# Patient Record
Sex: Female | Born: 1935 | Race: White | Hispanic: No | Smoking: Never smoker
Health system: Southern US, Community
[De-identification: ages and names within clinical notes are randomized; demographics above are authoritative.]

## PROBLEM LIST (undated history)

## (undated) DIAGNOSIS — I509 Heart failure, unspecified: Secondary | ICD-10-CM

## (undated) DIAGNOSIS — K219 Gastro-esophageal reflux disease without esophagitis: Secondary | ICD-10-CM

## (undated) DIAGNOSIS — R569 Unspecified convulsions: Secondary | ICD-10-CM

## (undated) DIAGNOSIS — E785 Hyperlipidemia, unspecified: Secondary | ICD-10-CM

## (undated) DIAGNOSIS — M1990 Unspecified osteoarthritis, unspecified site: Secondary | ICD-10-CM

## (undated) DIAGNOSIS — I10 Essential (primary) hypertension: Secondary | ICD-10-CM

## (undated) HISTORY — PX: BACK SURGERY: SHX140

## (undated) HISTORY — PX: FRACTURE SURGERY: SHX138

---

## 2004-01-28 ENCOUNTER — Ambulatory Visit: Payer: Self-pay

## 2004-11-24 ENCOUNTER — Emergency Department: Payer: Self-pay | Admitting: Emergency Medicine

## 2004-11-26 ENCOUNTER — Emergency Department: Payer: Self-pay | Admitting: Emergency Medicine

## 2004-12-02 ENCOUNTER — Other Ambulatory Visit: Payer: Self-pay

## 2004-12-02 ENCOUNTER — Ambulatory Visit: Payer: Self-pay | Admitting: Orthopedic Surgery

## 2004-12-03 ENCOUNTER — Ambulatory Visit: Payer: Self-pay | Admitting: Orthopedic Surgery

## 2005-02-27 ENCOUNTER — Ambulatory Visit: Payer: Self-pay

## 2006-05-20 ENCOUNTER — Ambulatory Visit: Payer: Self-pay | Admitting: Family Medicine

## 2007-01-13 ENCOUNTER — Ambulatory Visit: Payer: Self-pay | Admitting: Family Medicine

## 2007-02-01 ENCOUNTER — Ambulatory Visit: Payer: Self-pay | Admitting: Family Medicine

## 2007-06-11 ENCOUNTER — Emergency Department: Payer: Self-pay | Admitting: Emergency Medicine

## 2007-07-21 ENCOUNTER — Ambulatory Visit: Payer: Self-pay | Admitting: Family Medicine

## 2007-12-01 ENCOUNTER — Ambulatory Visit: Payer: Self-pay | Admitting: Family Medicine

## 2008-05-11 IMAGING — CR DG KNEE 1-2V*R*
1 series · 3 of 3 positions shown · non-contrast
Comparison: none

REASON FOR EXAM: pain
COMMENTS:

[Series 1: view not recorded · 0.17mm/px · 3 of 3 slices shown]
[im 1/3]
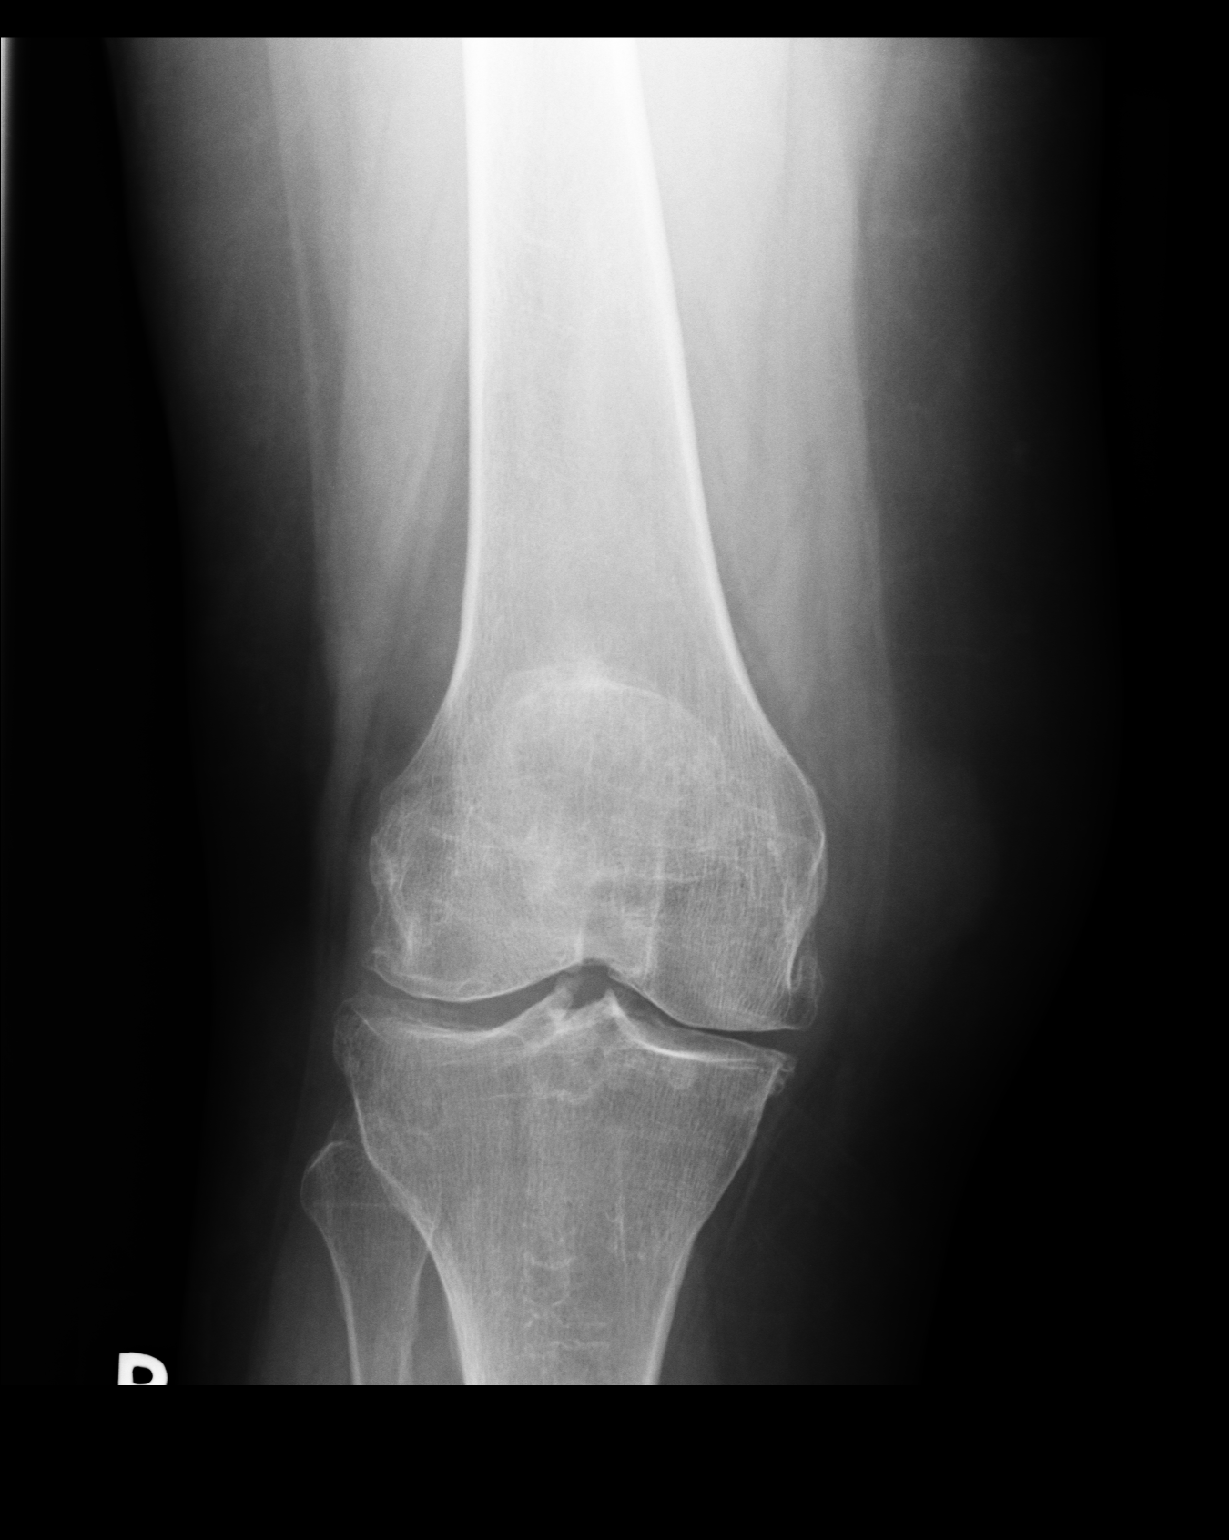
[im 2/3]
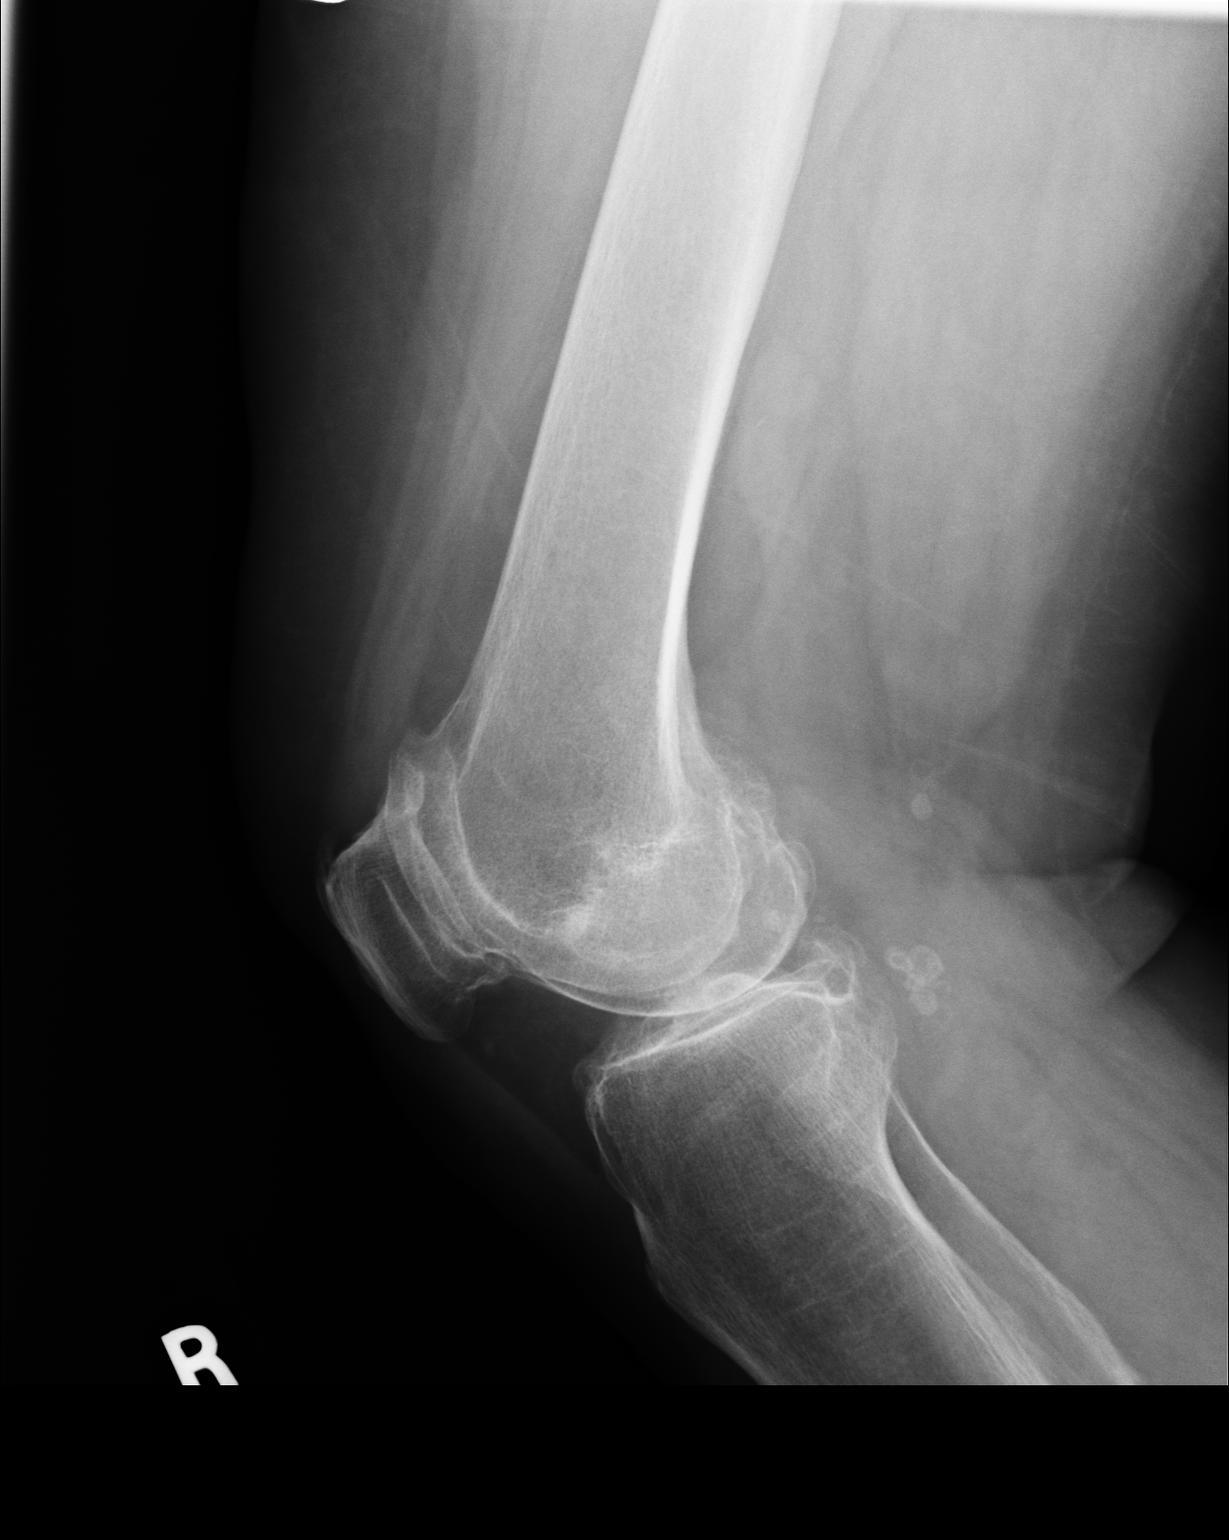
[im 3/3]
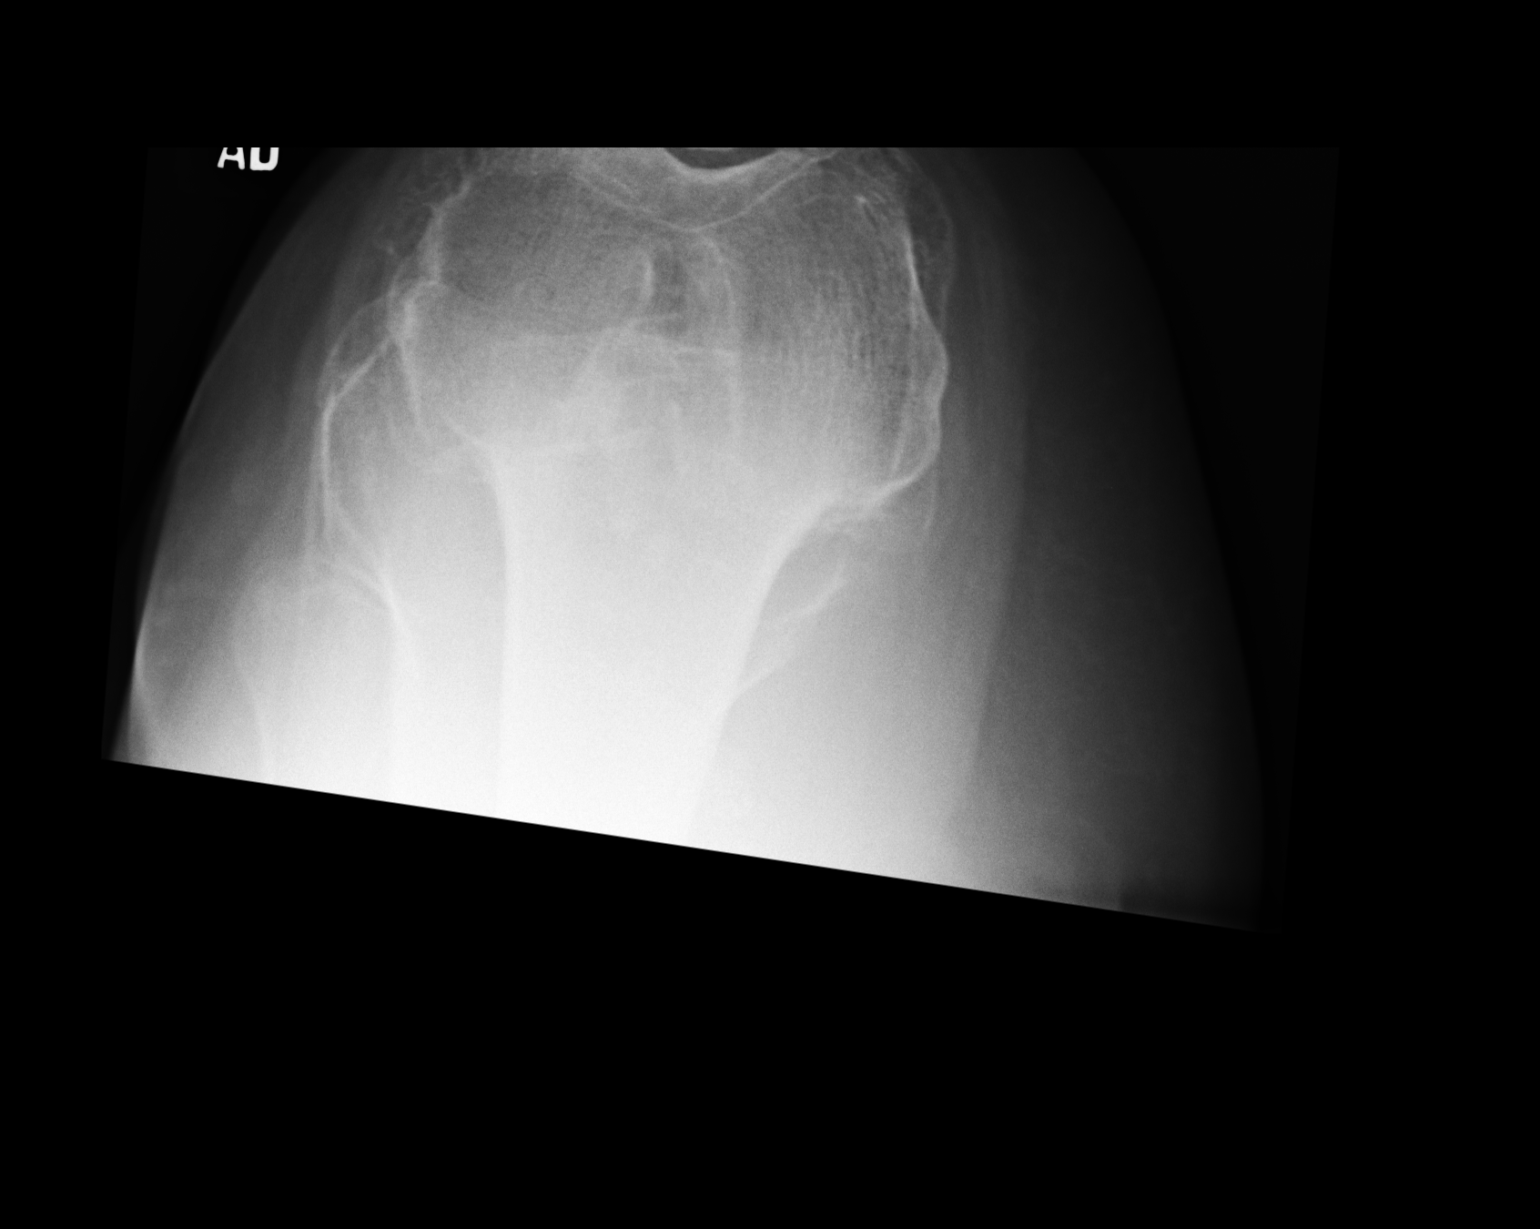

[3 of 3 positions shown; findings below may reference images not displayed]

PROCEDURE:     DXR - DXR KNEE RIGHT AP AND LATERAL  - February 01, 2007 [DATE]

RESULT:     AP and lateral views of the RIGHT knee show no fracture or
dislocation. Spur formation is noted medially. The findings are similar to
those noted on the LEFT and are consistent with arthritic change. There is
prominent dorsal patella spurring. No lytic or blastic lesions are seen.
IMPRESSION: 1. Arthritic change is noted at the knee joint medially and at the
femoropatellar joint.
2. No fracture is seen.

## 2008-08-01 ENCOUNTER — Ambulatory Visit: Payer: Self-pay | Admitting: Family Medicine

## 2008-09-26 ENCOUNTER — Ambulatory Visit: Payer: Self-pay | Admitting: Family Medicine

## 2008-10-18 ENCOUNTER — Emergency Department: Payer: Self-pay | Admitting: Emergency Medicine

## 2008-11-30 ENCOUNTER — Ambulatory Visit: Payer: Self-pay | Admitting: Gastroenterology

## 2008-12-19 ENCOUNTER — Emergency Department: Payer: Self-pay | Admitting: Emergency Medicine

## 2009-08-06 ENCOUNTER — Ambulatory Visit: Payer: Self-pay | Admitting: Family Medicine

## 2010-04-23 ENCOUNTER — Ambulatory Visit: Payer: Self-pay | Admitting: Ophthalmology

## 2010-05-05 ENCOUNTER — Ambulatory Visit: Payer: Self-pay | Admitting: Ophthalmology

## 2010-06-13 ENCOUNTER — Ambulatory Visit: Payer: Self-pay | Admitting: Ophthalmology

## 2010-06-16 ENCOUNTER — Ambulatory Visit: Payer: Self-pay | Admitting: Ophthalmology

## 2010-10-28 ENCOUNTER — Ambulatory Visit: Payer: Self-pay | Admitting: Family Medicine

## 2010-12-28 ENCOUNTER — Emergency Department: Payer: Self-pay | Admitting: Internal Medicine

## 2011-06-23 ENCOUNTER — Emergency Department: Payer: Self-pay | Admitting: Emergency Medicine

## 2011-06-25 ENCOUNTER — Ambulatory Visit: Payer: Self-pay | Admitting: Unknown Physician Specialty

## 2011-06-25 LAB — URINALYSIS, COMPLETE
Bilirubin,UR: NEGATIVE
Ketone: NEGATIVE
Leukocyte Esterase: NEGATIVE
Nitrite: NEGATIVE
Protein: NEGATIVE
Specific Gravity: 1.005 (ref 1.003–1.030)
Squamous Epithelial: 1
WBC UR: 1 /HPF (ref 0–5)

## 2011-07-02 ENCOUNTER — Ambulatory Visit: Payer: Self-pay | Admitting: Unknown Physician Specialty

## 2011-10-31 ENCOUNTER — Emergency Department: Payer: Self-pay | Admitting: Emergency Medicine

## 2011-12-01 ENCOUNTER — Ambulatory Visit: Payer: Self-pay | Admitting: Family Medicine

## 2012-09-02 ENCOUNTER — Other Ambulatory Visit: Payer: Self-pay | Admitting: Family Medicine

## 2012-09-02 LAB — PHENYTOIN LEVEL, TOTAL: Dilantin: 13.8 ug/mL (ref 10.0–20.0)

## 2012-12-09 ENCOUNTER — Ambulatory Visit: Payer: Self-pay | Admitting: Family Medicine

## 2013-11-11 ENCOUNTER — Emergency Department: Payer: Self-pay | Admitting: Emergency Medicine

## 2013-11-12 LAB — COMPREHENSIVE METABOLIC PANEL
ALT: 34 U/L
AST: 25 U/L (ref 15–37)
Albumin: 3.6 g/dL (ref 3.4–5.0)
Alkaline Phosphatase: 69 U/L
Anion Gap: 11 (ref 7–16)
BUN: 16 mg/dL (ref 7–18)
Bilirubin,Total: 0.3 mg/dL (ref 0.2–1.0)
CHLORIDE: 103 mmol/L (ref 98–107)
CREATININE: 0.76 mg/dL (ref 0.60–1.30)
Calcium, Total: 8.7 mg/dL (ref 8.5–10.1)
Co2: 24 mmol/L (ref 21–32)
EGFR (African American): >60
GLUCOSE: 104 mg/dL — AB (ref 65–99)
Osmolality: 277 (ref 275–301)
Potassium: 3.3 mmol/L — ABNORMAL LOW (ref 3.5–5.1)
SODIUM: 138 mmol/L (ref 136–145)
Total Protein: 7.3 g/dL (ref 6.4–8.2)

## 2013-11-12 LAB — CBC
HCT: 39.1 % (ref 35.0–47.0)
HGB: 12.9 g/dL (ref 12.0–16.0)
MCH: 32.4 pg (ref 26.0–34.0)
MCHC: 33.1 g/dL (ref 32.0–36.0)
MCV: 98 fL (ref 80–100)
Platelet: 213 10*3/uL (ref 150–440)
RBC: 3.99 10*6/uL (ref 3.80–5.20)
RDW: 12.7 % (ref 11.5–14.5)
WBC: 5.6 10*3/uL (ref 3.6–11.0)

## 2013-11-12 LAB — TROPONIN I: Troponin-I: <0.02 ng/mL

## 2013-11-12 LAB — PHENYTOIN LEVEL, TOTAL: Dilantin: 24.6 ug/mL — ABNORMAL HIGH (ref 10.0–20.0)

## 2013-12-11 ENCOUNTER — Inpatient Hospital Stay: Payer: Self-pay | Admitting: Internal Medicine

## 2013-12-11 LAB — CBC
HCT: 38.4 % (ref 35.0–47.0)
HGB: 12.6 g/dL (ref 12.0–16.0)
MCH: 32.1 pg (ref 26.0–34.0)
MCHC: 33 g/dL (ref 32.0–36.0)
MCV: 98 fL (ref 80–100)
Platelet: 211 10*3/uL (ref 150–440)
RBC: 3.93 10*6/uL (ref 3.80–5.20)
RDW: 13.1 % (ref 11.5–14.5)
WBC: 5.1 10*3/uL (ref 3.6–11.0)

## 2013-12-11 LAB — TROPONIN I: Troponin-I: <0.02 ng/mL

## 2013-12-11 LAB — URINALYSIS, COMPLETE
Bacteria: NONE SEEN
Bilirubin,UR: NEGATIVE
Glucose,UR: NEGATIVE mg/dL (ref 0–75)
Ketone: NEGATIVE
Nitrite: NEGATIVE
PH: 8 (ref 4.5–8.0)
Protein: NEGATIVE
RBC,UR: <1 /HPF (ref 0–5)
SPECIFIC GRAVITY: 1.005 (ref 1.003–1.030)
Squamous Epithelial: 3

## 2013-12-11 LAB — COMPREHENSIVE METABOLIC PANEL
ALBUMIN: 3.5 g/dL (ref 3.4–5.0)
ANION GAP: 7 (ref 7–16)
Alkaline Phosphatase: 66 U/L
BUN: 13 mg/dL (ref 7–18)
Bilirubin,Total: 0.3 mg/dL (ref 0.2–1.0)
CALCIUM: 8.8 mg/dL (ref 8.5–10.1)
CO2: 29 mmol/L (ref 21–32)
Chloride: 104 mmol/L (ref 98–107)
Creatinine: 0.77 mg/dL (ref 0.60–1.30)
EGFR (Non-African Amer.): >60
Glucose: 105 mg/dL — ABNORMAL HIGH (ref 65–99)
OSMOLALITY: 280 (ref 275–301)
Potassium: 3.7 mmol/L (ref 3.5–5.1)
SGOT(AST): 21 U/L (ref 15–37)
SGPT (ALT): 23 U/L
Sodium: 140 mmol/L (ref 136–145)
Total Protein: 7.1 g/dL (ref 6.4–8.2)

## 2013-12-11 LAB — PHENYTOIN LEVEL, TOTAL: Dilantin: 42.6 ug/mL (ref 10.0–20.0)

## 2013-12-12 LAB — CBC WITH DIFFERENTIAL/PLATELET
BASOS PCT: 0.5 %
Basophil #: 0 10*3/uL (ref 0.0–0.1)
EOS ABS: 0.2 10*3/uL (ref 0.0–0.7)
Eosinophil %: 4.1 %
HCT: 36.6 % (ref 35.0–47.0)
HGB: 12.2 g/dL (ref 12.0–16.0)
LYMPHS PCT: 19.8 %
Lymphocyte #: 1.1 10*3/uL (ref 1.0–3.6)
MCH: 32.2 pg (ref 26.0–34.0)
MCHC: 33.3 g/dL (ref 32.0–36.0)
MCV: 97 fL (ref 80–100)
MONOS PCT: 10.7 %
Monocyte #: 0.6 x10 3/mm (ref 0.2–0.9)
Neutrophil #: 3.5 10*3/uL (ref 1.4–6.5)
Neutrophil %: 64.9 %
Platelet: 190 10*3/uL (ref 150–440)
RBC: 3.8 10*6/uL (ref 3.80–5.20)
RDW: 13 % (ref 11.5–14.5)
WBC: 5.4 10*3/uL (ref 3.6–11.0)

## 2013-12-12 LAB — COMPREHENSIVE METABOLIC PANEL
ALBUMIN: 3.2 g/dL — AB (ref 3.4–5.0)
ANION GAP: 5 — AB (ref 7–16)
Alkaline Phosphatase: 63 U/L
BILIRUBIN TOTAL: 0.4 mg/dL (ref 0.2–1.0)
BUN: 8 mg/dL (ref 7–18)
CALCIUM: 8.5 mg/dL (ref 8.5–10.1)
Chloride: 107 mmol/L (ref 98–107)
Co2: 29 mmol/L (ref 21–32)
Creatinine: 0.7 mg/dL (ref 0.60–1.30)
EGFR (African American): >60
EGFR (Non-African Amer.): >60
Glucose: 124 mg/dL — ABNORMAL HIGH (ref 65–99)
OSMOLALITY: 281 (ref 275–301)
POTASSIUM: 3.4 mmol/L — AB (ref 3.5–5.1)
SGOT(AST): 17 U/L (ref 15–37)
SGPT (ALT): 21 U/L
Sodium: 141 mmol/L (ref 136–145)
Total Protein: 6.5 g/dL (ref 6.4–8.2)

## 2013-12-12 LAB — LIPID PANEL
Cholesterol: 134 mg/dL (ref 0–200)
HDL: 54 mg/dL (ref 40–60)
LDL CHOLESTEROL, CALC: 52 mg/dL (ref 0–100)
TRIGLYCERIDES: 141 mg/dL (ref 0–200)
VLDL CHOLESTEROL, CALC: 28 mg/dL (ref 5–40)

## 2013-12-12 LAB — PHENYTOIN LEVEL, TOTAL: Dilantin: 39.3 ug/mL (ref 10.0–20.0)

## 2013-12-13 LAB — PHENYTOIN LEVEL, TOTAL: Dilantin: 34.7 ug/mL — ABNORMAL HIGH (ref 10.0–20.0)

## 2014-07-07 NOTE — Discharge Summary (Signed)
PATIENT NAME:  Hannah DallasAYLOR, Ahlana B MR#:  [id number] DATE OF BIRTH:  1934/09/23  DATE OF ADMISSION:  12/11/2013 DATE OF DISCHARGE:  12/14/2013  ADMITTING DIAGNOSIS: Confusion, weakness, ataxia.   DISCHARGE DIAGNOSES: 1.  Weakness and ataxia, likely related to Dilantin toxicity. Her gait is much improved.  2.  Encephalopathy, possibly related to Dilantin toxicity. There was no evidence of acute cerebrovascular accident noted on MRI.  3.  Previous history of cerebrovascular accident, based on MRI.  4.  Gait instability and deconditioning. The patient will have physical therapy at home.  5.  Hypertension.  6.  Hyperlipidemia.  7.  History of seizure.  8.  History of inflammatory polyarthritis.  9.  History of hyperlipidemia.  10.  History of asthma.  11.  Gastroesophageal reflux disease. 12.  Status post hysterectomy.  13.  Status post right wrist surgery.  CONSULTANTS: Physical therapy.   DIAGNOSTIC DATA: Labs: WBC count on admission 5, hemoglobin 12.5, platelet count 211,000. Sodium 140, potassium 3.7, chloride 104, bicarb 29, BUN 13, creatinine 0.77, glucose 105, calcium 8.8, AST 23, ALT 21.  CT of the head showed age indeterminate left PCA territory infarct.   EKG: Normal sinus rhythm.  Dilantin level was 34.7 on September 30th.   HOSPITAL COURSE: Please refer to H and P done by the admitting physician. The patient is a 80 year old white female who was brought by EMS after the patient had confusion and weakness and trouble with ambulation. Apparently the patient was not taking her Dilantin correctly and was noted to have high Dilantin level on admission. She was thought to have Dilantin toxicity. She was also confused on presentation and was admitted to the hospital, given IV fluids. Her Dilantin was held. The patient also underwent a MRI for acute encephalopathy. There was old stroke, but nothing new. She was monitored in the hospital with improvement in her mental status and her gait. She  was seen by physical therapy who recommended home with PT. At this time, arrangements for home with physical therapy have been made. She is doing much better and is stable for discharge.   DISCHARGE MEDICATIONS: Omeprazole 20 one tab p.o. daily, hydrochlorothiazide/losartan 25/100 daily, meloxicam 7.5 one tab p.o. b.i.d., pravastatin 20 at bedtime, Dilantin 300 at bedtime, acetaminophen 650 q. 4 p.r.n., aspirin 81 one tab p.o. daily.   DISCHARGE DIET: Low-sodium, low-fat, low-cholesterol.   DISCHARGE ACTIVITY: As tolerated.   DISCHARGE FOLLOWUP AND INSTRUCTIONS: Follow up with primary MD in 1 to 2 weeks. Resume Dilantin on October 2nd at 300 mg at bedtime only. The patient to have physical therapy and nurse to visit her.   TIME SPENT: 35 minutes.  ____________________________ Lacie ScottsShreyang H. Allena KatzPatel, MD shp:sb D: 12/15/2013 08:36:14 ET T: 12/15/2013 11:31:29 ET JOB#: [id number]  cc: Kolston Lacount H. Allena KatzPatel, MD, <Dictator> Charise CarwinSHREYANG H Harlow Basley MD ELECTRONICALLY SIGNED 12/24/2013 8:54

## 2014-07-07 NOTE — H&P (Signed)
PATIENT NAME:  Hannah Gordon, OHMER MR#:  [id number] DATE OF BIRTH:  20-Aug-1934  DATE OF ADMISSION:  12/11/2013  PRIMARY CARE PHYSICIAN: As per the medical chart is Jerl Mina, MD   CHIEF COMPLAINT: Confusion, weakness, ataxia.   HISTORY OF PRESENT ILLNESS:  A 80 year old female who was brought in via EMS after receiving a call for confusion, weakness, and trouble walking.  The patient is not a good historian due to her confusion.  She had a CT of the head performed here which showed age indeterminate left PCA territory infarct.  Her Dilantin level was 42.61 twice normal.  Apparently, per speaking with the ER physician, her Dilantin has been recently increased.   REVIEW OF SYSTEMS:  Unable to obtain as the patient is very confused.   PAST MEDICAL HISTORY: As per medical chart:  1.  Hypertension.  2.  Inflammatory polyarthritis. 3.  Seizures. 4.  Arthritis.  5.  Hyperlipidemia.  6.  Asthma.  7.  GERD.   PAST SURGICAL HISTORY: As per medical chart: 1.  Hysterectomy. 2.  Right wrist.    ALLERGIES: No known drug allergies.   MEDICATIONS:  1.  Pravastatin 20 mg at bedtime.    2.  Phenytoin 100 mg 2 tablets b.i.d.  3.  Omeprazole 20 mg daily.  4.  Meloxicam 7.5 daily.  5.  Hydrochlorothiazide losartan 1 tablet daily.   FAMILY HISTORY: Unable to obtain as the patient was confused.   SOCIAL HISTORY: As per medical chart, no tobacco use, unknown about alcohol or IV drug use.   PHYSICAL EXAMINATION:  VITAL SIGNS: Temperature 98.3, pulse 74, respirations 18, blood pressure 116/48 and 100% on room air.  GENERAL: The patient is alert not oriented to place or date or time.  HEENT: Head is atraumatic.  Pupils are round and reactive.  Sclerae are anicteric.  Mucous membranes are moist.  OROPHARYNX: Clear.  NECK: Supple. No JVD, carotid bruit, or enlarged thyroid.  CARDIOVASCULAR: Regular rate and rhythm. No murmurs, gallops, or rubs. PMI is not displaced. LUNGS:  Clear to auscultation  without crackles, rales, rhonchi or wheezing.  No respiratory distress.  Normal chest expansion.  ABDOMEN:  Bowel sounds are positive. Nontender, nondistended.  No hepatosplenomegaly. No rebound, guarding.  EXTREMITIES: No clubbing, cyanosis or edema.  SKIN: Without rash or lesions.  BACK: No CVA or vertebral tenderness.  NEUROLOGIC:  Cranial nerves II through XII are intact.  She does have some right-sided mild nystagmus. Babinski sign is downgoing. Cerebellar exam, including heel-to-toe, is negative.  We did not ambulate the patient as she is very weak.  MUSCULOSKELETAL: There is 5/5 strength in all extremities.   LABORATORY DATA: White blood cell 5, hemoglobin 12.6, hematocrit 39,  platelets 211, 000, sodium 140, potassium 3.7, chloride 104, bicarbonate 29,  BUN 13, creatinine 0.77, glucose 105, calcium 8.8, bilirubin 0.3, alkaline phosphatase 66, AST 23, ALT 21, total protein 7.1, albumin 3.5.  Troponin less than 0.02, Dilantin 42.6.  CT of the head shows an age indeterminate left PCA territory infarct.   EKG is normal sinus rhythm. No ST elevation or depression.   ASSESSMENT AND PLAN: A 80 year old female with seizure disorder, recently had her Dilantin was increased, who presents with increasing confusion, and ataxia with an elevated Dilantin level.  1.  Dilantin toxicity could be the etiology of her confusion and ataxia.  She had a CT of the head which shows an indeterminate left PCA territory stroke as well.  Obviously, holding her Dilantin level, the  Emergency Room physician felt that she needed an IVC as she was agitated prior to me seeing her which they are filling out her IVC Dilantin toxicity.  We will continue to monitor the patient's symptoms with neurologic checks every 4 hours.  Provide IV fluids.  Recheck a Dilantin level.  2.  Encephalopathy, acute, likely secondary to Dilantin toxicity.  We will rule out an acute stroke with an MRI.  I have started the patient on aspirin.  She will  continue her statin medication.  3.  History of hypertension, continue hydrochlorothiazide losartan.  4.  Hyperlipidemia. Continue pravastatin.  5.  History of seizures.  The patient's Dilantin level is supratherapeutic.  We will hold Dilantin. Monitor and the patient should be on some seizure precautions with neurologic checks.   CODE STATUS: The patient at this time appears to be a FULL CODE STATUS.  We will clarify once her mental status is back to baseline.   TIME SPENT:  Approximately 45 minutes.     ____________________________ Janyth ContesSital P. Juliene PinaMody, MD spm:DT D: 12/11/2013 13:48:53 ET T: 12/11/2013 14:19:55 ET JOB#: [id number]  cc: Raiven Belizaire P. Juliene PinaMody, MD, <Dictator> Janyth ContesSITAL P Quame Spratlin MD ELECTRONICALLY SIGNED 12/11/2013 15:19

## 2014-07-08 NOTE — Op Note (Signed)
PATIENT NAME:  Hannah Gordon, Hannah Gordon MR#:  [id number] DATE OF BIRTH:  [birth year]  DATE OF PROCEDURE:  07/02/2011  PREOPERATIVE DIAGNOSIS: Comminuted displaced left distal radius and ulnar fracture.   POSTOPERATIVE DIAGNOSIS: Comminuted displaced left distal radius and ulnar fracture.   PROCEDURE: Open reduction and internal fixation of distal radius.  SURGEON: Ruthann CancerJames Shamica Moree, MD  ASSISTANT: None.   ANESTHESIA: General.   ESTIMATED BLOOD LOSS:   COMPLICATIONS: None.   TOURNIQUET: Not inflated.   IMPLANTS USED: [employer] volar plate.   BRIEF CLINICAL NOTE AND PATHOLOGY: The patient suffered the above-mentioned fracture. Surgery was recommended, options, risks, and benefits were discussed. At time of the procedure, the fracture did reduce well and fixation was good.   DESCRIPTION OF PROCEDURE: Preop antibiotics, adequate general anesthesia, supine position, routine prepping and draping. Appropriate time out was called. The fracture was reduced with traction and manipulation and countertraction. Direct exposure was made using the usual volar plates retracting the flexor carpi radialis to the ulnar aspect, neurovascular structures to the radial aspect. Pronator was elevated. The fracture was directly visualized and further reduced with an osteotome. The plate was then applied in routine fashion and adjusted appropriately. The distal threaded pegs and regular pegs were applied. Fixation was excellent. The third cortical screw was placed. AP, lateral, and oblique views showed good positioning. The incision was thoroughly irrigated. The area had previously been injected with Marcaine. Hemostasis was good. Pronator was reapproximated. Subcutaneous was closed with 2-0 Vicryl. Skin was closed with Monocryl. A soft sterile dressing was applied. Sponge and needle counts were reported as correct prior to and after wound closure. The patient was awakened and taken to the postanesthesia care unit having  tolerated the procedure well, in a wrist splint.  ____________________________ Winn JockJames C. Gerrit Heckaliff, MD jcc:slb D: 07/02/2011 14:32:09 ET T: 07/02/2011 16:29:57 ET JOB#: [id number]  cc: Winn JockJames C. Gerrit Heckaliff, MD, <Dictator> Winn JockJAMES C Dominyk Law MD ELECTRONICALLY SIGNED 07/05/2011 18:46

## 2015-01-07 ENCOUNTER — Other Ambulatory Visit: Payer: Self-pay | Admitting: Family Medicine

## 2015-01-07 DIAGNOSIS — I10 Essential (primary) hypertension: Secondary | ICD-10-CM | POA: Diagnosis not present

## 2015-01-07 DIAGNOSIS — N390 Urinary tract infection, site not specified: Secondary | ICD-10-CM | POA: Diagnosis not present

## 2015-01-07 DIAGNOSIS — M1990 Unspecified osteoarthritis, unspecified site: Secondary | ICD-10-CM | POA: Diagnosis not present

## 2015-01-07 DIAGNOSIS — R569 Unspecified convulsions: Secondary | ICD-10-CM | POA: Diagnosis not present

## 2015-01-07 DIAGNOSIS — Z1239 Encounter for other screening for malignant neoplasm of breast: Secondary | ICD-10-CM | POA: Diagnosis not present

## 2015-01-07 DIAGNOSIS — Z1231 Encounter for screening mammogram for malignant neoplasm of breast: Secondary | ICD-10-CM

## 2015-01-07 DIAGNOSIS — K219 Gastro-esophageal reflux disease without esophagitis: Secondary | ICD-10-CM | POA: Diagnosis not present

## 2015-01-07 DIAGNOSIS — E785 Hyperlipidemia, unspecified: Secondary | ICD-10-CM | POA: Diagnosis not present

## 2015-01-08 DIAGNOSIS — N390 Urinary tract infection, site not specified: Secondary | ICD-10-CM | POA: Diagnosis not present

## 2015-01-16 ENCOUNTER — Ambulatory Visit: Payer: Self-pay | Attending: Family Medicine

## 2015-03-14 DIAGNOSIS — M25561 Pain in right knee: Secondary | ICD-10-CM | POA: Diagnosis not present

## 2015-03-14 DIAGNOSIS — G40909 Epilepsy, unspecified, not intractable, without status epilepticus: Secondary | ICD-10-CM | POA: Diagnosis not present

## 2015-03-14 DIAGNOSIS — M2550 Pain in unspecified joint: Secondary | ICD-10-CM | POA: Diagnosis not present

## 2015-03-14 DIAGNOSIS — M1990 Unspecified osteoarthritis, unspecified site: Secondary | ICD-10-CM | POA: Diagnosis not present

## 2015-03-14 DIAGNOSIS — R569 Unspecified convulsions: Secondary | ICD-10-CM | POA: Diagnosis not present

## 2015-03-14 DIAGNOSIS — I10 Essential (primary) hypertension: Secondary | ICD-10-CM | POA: Diagnosis not present

## 2015-03-14 DIAGNOSIS — G8929 Other chronic pain: Secondary | ICD-10-CM | POA: Diagnosis not present

## 2015-03-14 DIAGNOSIS — J0100 Acute maxillary sinusitis, unspecified: Secondary | ICD-10-CM | POA: Diagnosis not present

## 2015-03-21 DIAGNOSIS — R739 Hyperglycemia, unspecified: Secondary | ICD-10-CM | POA: Diagnosis not present

## 2015-03-21 IMAGING — CT CT HEAD WITHOUT CONTRAST
1 series · 16 of 30 positions shown, 20 images · non-contrast
Comparison: None.

CLINICAL DATA: Weakness and slurred speech.

EXAM:
CT HEAD WITHOUT CONTRAST
TECHNIQUE: Contiguous axial images were obtained from the base of the skull
through the vertex without intravenous contrast.

[Series 2: head wo · axial · 0.41mm/px · z∈[-630,-504]mm · 16 of 32 slices shown, 20 images]
[im 2/32  brain]
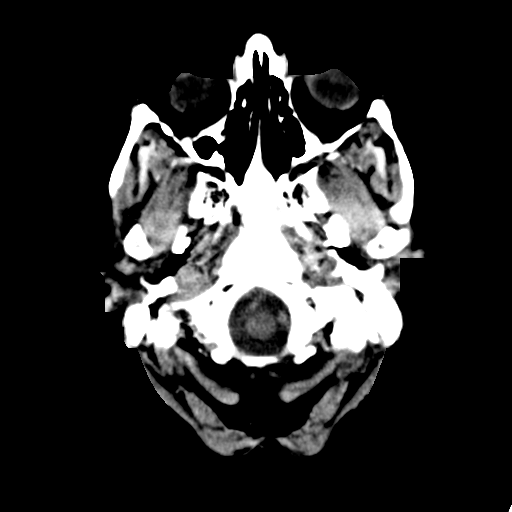
[im 2/32  bone]
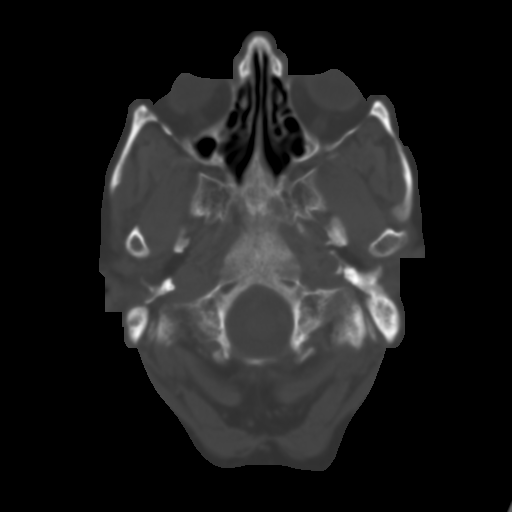
[im 4/32  brain]
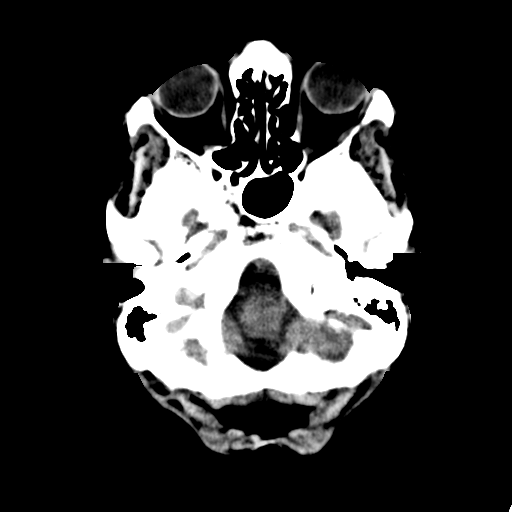
[im 6/32  brain]
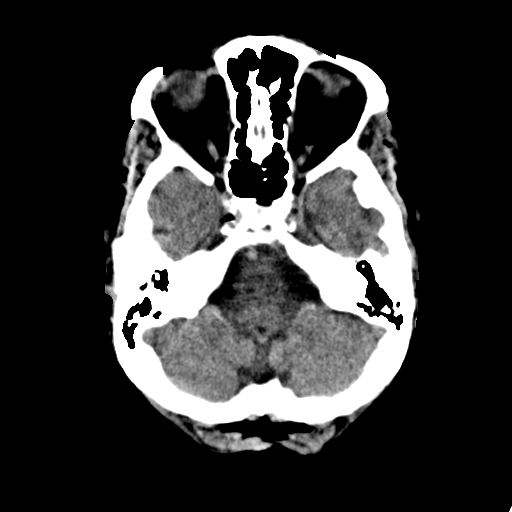
[im 8/32  brain]
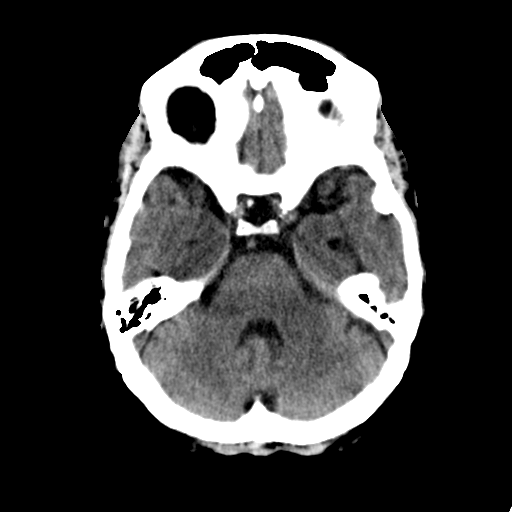
[im 9/32  brain]
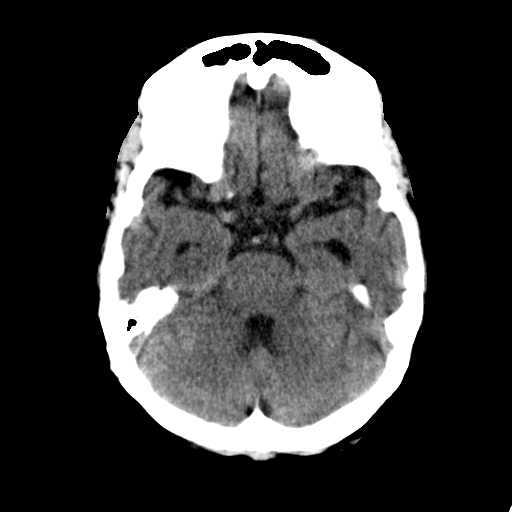
[im 9/32  bone]
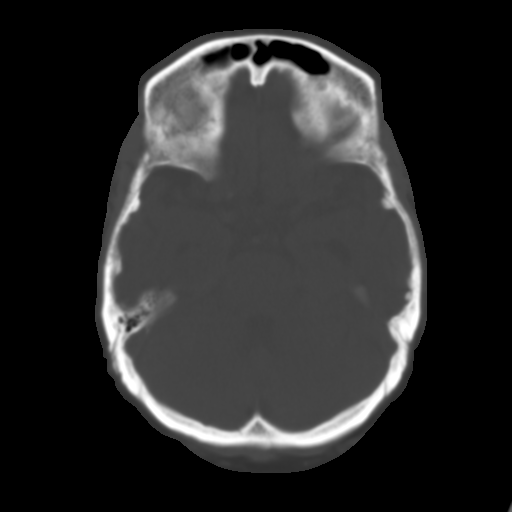
[im 11/32  brain]
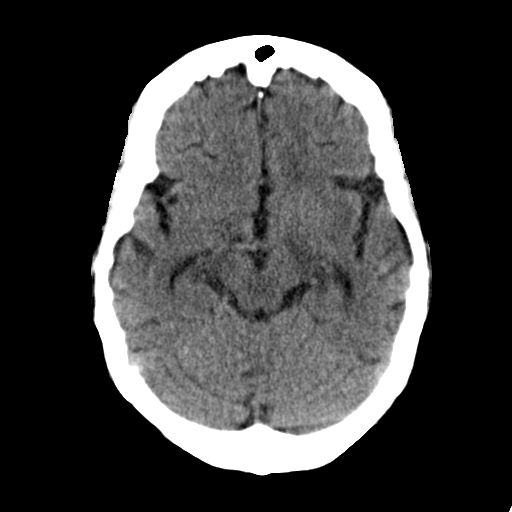
[im 13/32  brain]
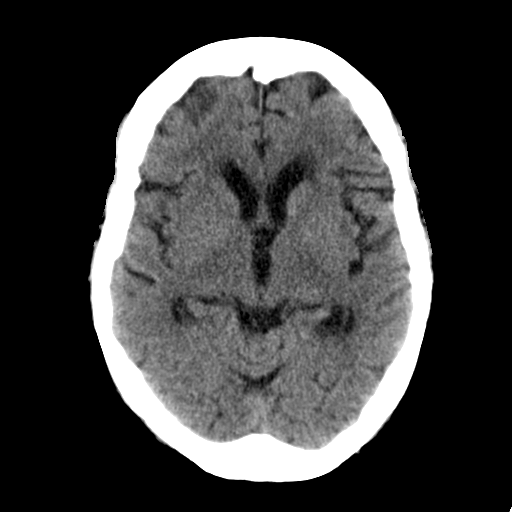
[im 15/32  brain]
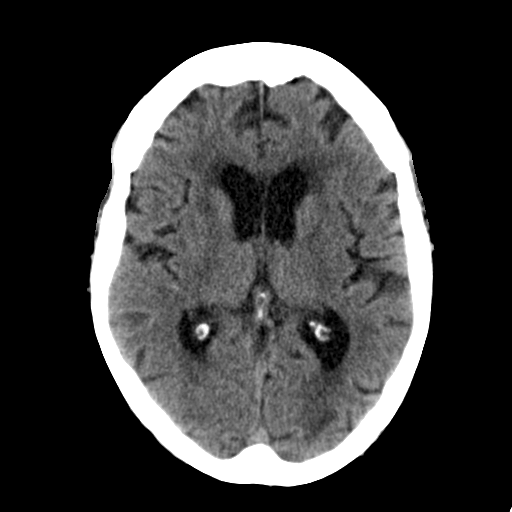
[im 17/32  brain]
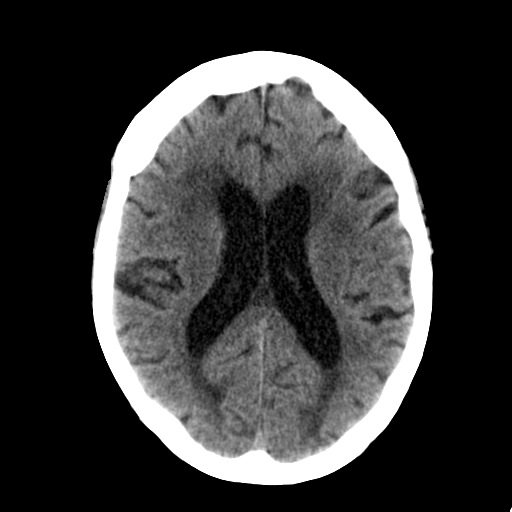
[im 17/32  bone]
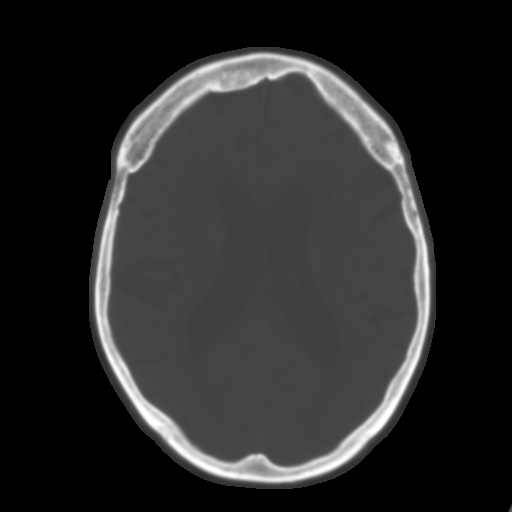
[im 19/32  brain]
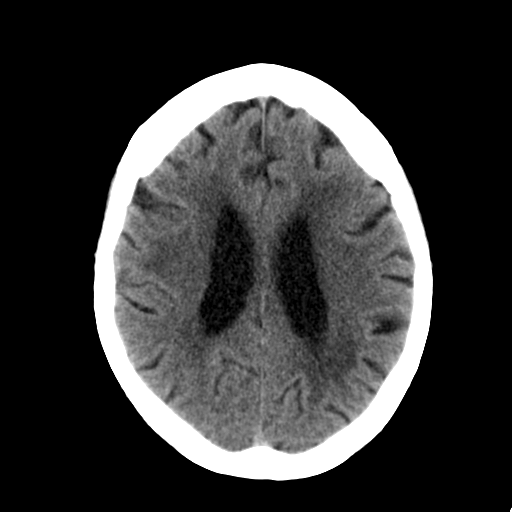
[im 21/32  brain]
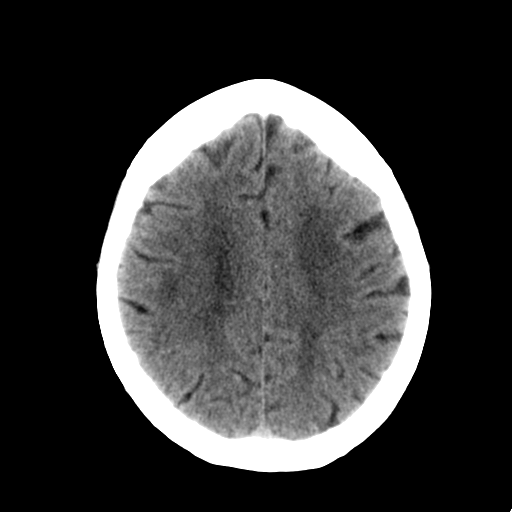
[im 23/32  brain]
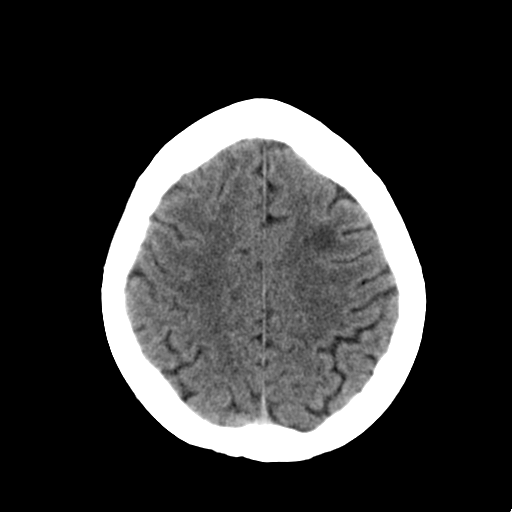
[im 24/32  brain]
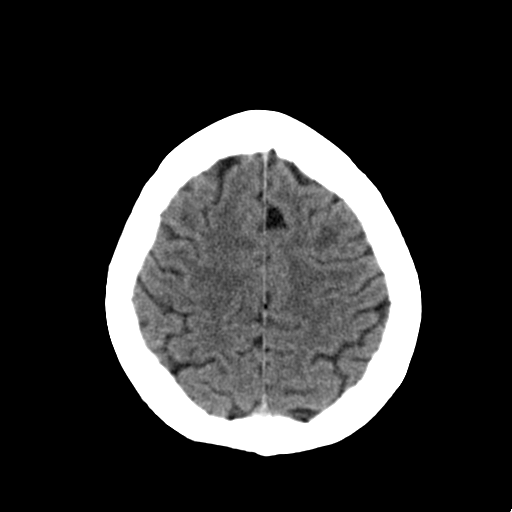
[im 24/32  bone]
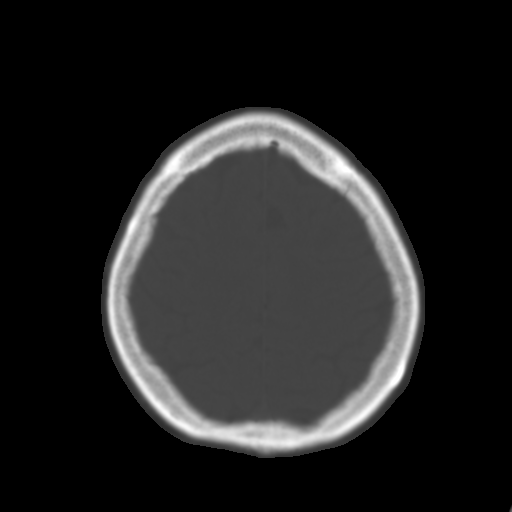
[im 26/32  brain]
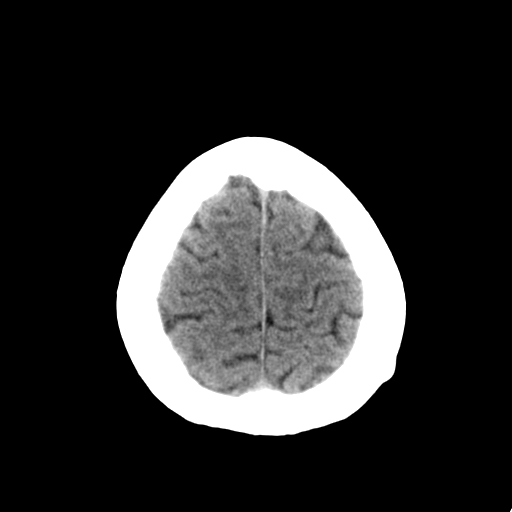
[im 28/32  brain]
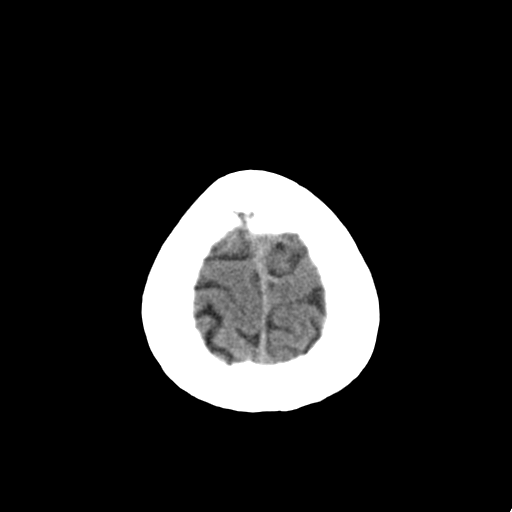
[im 30/32  brain]
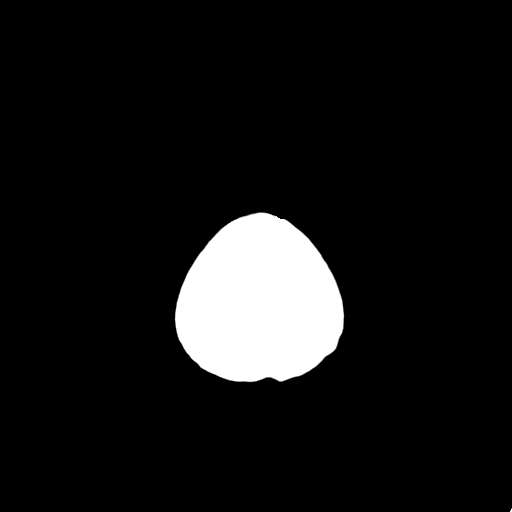

[16 of 30 positions shown; findings below may reference images not displayed]

FINDINGS: There is decreased attenuation in the left PCA territory compatible
with age indeterminate infarct. The patient has extensive chronic
microvascular ischemic change. No evidence of hemorrhage, mass
lesion, mass effect, midline shift or abnormal extra-axial fluid
collection is identified. There is cortical atrophy. The calvarium
is intact.
IMPRESSION: Age-indeterminate left PCA territory infarct. Brain MRI could be
used for definitive characterization.

Atrophy and chronic microvascular ischemic change.

## 2015-06-21 DIAGNOSIS — R739 Hyperglycemia, unspecified: Secondary | ICD-10-CM | POA: Diagnosis not present

## 2015-07-06 ENCOUNTER — Emergency Department: Payer: Commercial Managed Care - HMO

## 2015-07-06 ENCOUNTER — Emergency Department
Admission: EM | Admit: 2015-07-06 | Discharge: 2015-07-06 | Disposition: A | Discharge status: Home / Self Care | Payer: Commercial Managed Care - HMO | Attending: Emergency Medicine | Admitting: Emergency Medicine

## 2015-07-06 ENCOUNTER — Encounter: Payer: Self-pay | Admitting: Emergency Medicine

## 2015-07-06 DIAGNOSIS — M170 Bilateral primary osteoarthritis of knee: Secondary | ICD-10-CM | POA: Diagnosis not present

## 2015-07-06 DIAGNOSIS — M25562 Pain in left knee: Secondary | ICD-10-CM | POA: Diagnosis not present

## 2015-07-06 DIAGNOSIS — I10 Essential (primary) hypertension: Secondary | ICD-10-CM | POA: Diagnosis not present

## 2015-07-06 DIAGNOSIS — M7989 Other specified soft tissue disorders: Secondary | ICD-10-CM | POA: Diagnosis not present

## 2015-07-06 DIAGNOSIS — E785 Hyperlipidemia, unspecified: Secondary | ICD-10-CM | POA: Diagnosis not present

## 2015-07-06 DIAGNOSIS — M25561 Pain in right knee: Secondary | ICD-10-CM | POA: Diagnosis not present

## 2015-07-06 HISTORY — DX: Unspecified osteoarthritis, unspecified site: M19.90

## 2015-07-06 HISTORY — DX: Unspecified convulsions: R56.9

## 2015-07-06 HISTORY — DX: Hyperlipidemia, unspecified: E78.5

## 2015-07-06 HISTORY — DX: Essential (primary) hypertension: I10

## 2015-07-06 MED ORDER — TRAMADOL HCL 50 MG PO TABS
50.0000 mg | ORAL_TABLET | Freq: Once | ORAL | Status: AC
  Administered 2015-07-06: 50 mg via ORAL

## 2015-07-06 MED ORDER — MELOXICAM 7.5 MG PO TABS: 7.5000 mg | ORAL_TABLET | Freq: Every day | ORAL | Status: DC

## 2015-07-06 MED ORDER — TRAMADOL HCL 50 MG PO TABS
ORAL_TABLET | ORAL | Status: AC
  Filled 2015-07-06: qty 1

## 2015-07-06 MED ORDER — TRAMADOL HCL 50 MG PO TABS: 50.0000 mg | ORAL_TABLET | Freq: Four times a day (QID) | ORAL | Status: DC | PRN

## 2015-07-06 NOTE — ED Notes (Signed)
Pt c/o of intermittent bilateral knee pain. Pt currently c/o pain in left knee. fPt reports history of arthritis. Pt denies any injury to the area. Pt states: "I don't know what happened. It might have been when I was playing with the kids."

## 2015-07-06 NOTE — Discharge Instructions (Signed)

## 2015-07-06 NOTE — ED Notes (Signed)
Pt to triage via wheelchair with friend c/o bilateral knee pain, pt reports that pain started in right knee approx one month ago then pain went left knee, "crushing" pain when pt puts weight on knees, denies pain when sitting.

## 2015-07-06 NOTE — ED Provider Notes (Signed)
Hannah Gordon  ____________________________________________  Time seen: Approximately 10:46 PM  I have reviewed the triage vital signs and the nursing notes.   HISTORY  Chief Complaint Leg Pain    HPI Hannah Gordon is a 81 y.o. female who presents emergency department complaining of bilateral knee pain. Patient states that she's been having pain over the last month. Patient states that pain began in her right knee and has transitioned to her left. Patient states that she does have a history of arthritis but has not seen an orthopedic surgeon specifically for her knees. Patient denies any known injury to bilateral knees. She states that it is a sharp, burning, crushing pain when she is weightbearing but states that symptoms will resolve with nonweightbearing activity. Patient has not taken any medication for same.   Past Medical History  Diagnosis Date  . Arthritis   . Hypertension   . Seizures (HCC)   . Hyperlipemia     There are no active problems to display for this patient.   Past Surgical History  Procedure Laterality Date  . Back surgery    . Fracture surgery      Current Outpatient Rx  Name  Route  Sig  Dispense  Refill  . meloxicam (MOBIC) 7.5 MG tablet   Oral   Take 1 tablet (7.5 mg total) by mouth daily.   30 tablet   0   . traMADol (ULTRAM) 50 MG tablet   Oral   Take 1 tablet (50 mg total) by mouth every 6 (six) hours as needed.   20 tablet   0     Allergies Review of patient's allergies indicates no known allergies.  History reviewed. No pertinent family history.  Social History Social History  Substance Use Topics  . Smoking status: Never Smoker   . Smokeless tobacco: None  . Alcohol Use: No     Review of Systems  Constitutional: No fever/chills Cardiovascular: no chest pain. Respiratory: no cough. No SOB. Musculoskeletal: Positive for bilateral knee pain. Skin: Negative for  rash. Neurological: Negative for headaches, focal weakness or numbness. 10-point ROS otherwise negative.  ____________________________________________   PHYSICAL EXAM:  VITAL SIGNS: ED Triage Vitals  Enc Vitals Group     BP 07/06/15 2038 154/68 mmHg     Pulse Rate 07/06/15 2038 66     Resp 07/06/15 2038 18     Temp 07/06/15 2038 97.5 F (36.4 C)     Temp Source 07/06/15 2038 Oral     SpO2 07/06/15 2038 98 %     Weight 07/06/15 2038 200 lb (90.719 kg)     Height 07/06/15 2038 5\' 5"  (1.651 m)     Head Cir --      Peak Flow --      Pain Score --      Pain Loc --      Pain Edu? --      Excl. in GC? --      Constitutional: Alert and oriented. Well appearing and in no acute distress. Eyes: Conjunctivae are normal. PERRL. EOMI. Head: Atraumatic. Cardiovascular: Normal rate, regular rhythm. Normal S1 and S2.  Good peripheral circulation. Respiratory: Normal respiratory effort without tachypnea or retractions. Lungs CTAB. Musculoskeletal: No visible deformity or edema to bilateral knees upon inspection. Patient has full range of motion to knee swelling in bed. Patient does not have tenderness to palpation. There is, valgus, Lachman's are negative. McMurray's results in pain to bilateral knees. Neurologic:  Normal speech and language. No gross focal neurologic deficits are appreciated.  Skin:  Skin is warm, dry and intact. No rash noted. Psychiatric: Mood and affect are normal. Speech and behavior are normal. Patient exhibits appropriate insight and judgement.   ____________________________________________   LABS (all labs ordered are listed, but only abnormal results are displayed)  Labs Reviewed - No data to display ____________________________________________  EKG   ____________________________________________  RADIOLOGY Festus Barren Jasmyne Lodato, personally viewed and evaluated these images (plain radiographs) as part of my medical decision making, as well as reviewing  the written report by the radiologist.  Dg Knee Complete 4 Views Left  07/06/2015  CLINICAL DATA:  Swelling in the knee. Inferior patellar pain while walking. Unable to straighten. No known injury. EXAM: LEFT KNEE - COMPLETE 4+ VIEW COMPARISON:  02/01/2007 FINDINGS: Prominent degenerative changes in the left knee with joint space narrowing and severe osteophyte formation in the medial, lateral, and patellofemoral compartments. No evidence of acute fracture or dislocation. No focal bone lesion or bone destruction. No significant effusion. IMPRESSION: Severe tricompartment degenerative changes in the left knee. Progression of degenerative changes since previous study. No acute bony abnormalities. Electronically Signed   By: Burman Nieves M.D.   On: 07/06/2015 22:13   Dg Knee Complete 4 Views Right  07/06/2015  CLINICAL DATA:  Bilateral knee pain and swelling, worse with weight-bearing. No reported injury. EXAM: RIGHT KNEE - COMPLETE 4+ VIEW COMPARISON:  None. FINDINGS: No fracture, dislocation or focal osseous lesion. Probable trace suprapatellar right knee joint effusion. Moderate to severe medial compartment, moderate lateral compartment and severe patellofemoral compartment osteoarthritis. Small superior right patellar enthesophyte. IMPRESSION: Moderate to severe tricompartmental osteoarthritis in the right knee joint with probable trace suprapatellar right knee joint effusion. Electronically Signed   By: Delbert Phenix M.D.   On: 07/06/2015 22:12    ____________________________________________    PROCEDURES  Procedure(s) performed:       Medications  traMADol (ULTRAM) tablet 50 mg (50 mg Oral Given 07/06/15 2254)     ____________________________________________   INITIAL IMPRESSION / ASSESSMENT AND PLAN / ED COURSE  Pertinent labs & imaging results that were available during my care of the patient were reviewed by me and considered in my medical decision making (see chart for  details).  Patient's diagnosis is consistent with osteoarthritis to bilateral knees. At this point osteoarthritis is deemed severe. Patient has not been told that she cannot take anti-inflammatories and placed on anti-inflammatories and pain medication at home. She is to follow-up with orthopedics. Patient is given ED precautions to return to the ED for any worsening or new symptoms.     ____________________________________________  FINAL CLINICAL IMPRESSION(S) / ED DIAGNOSES  Final diagnoses:  Osteoarthritis of both knees, unspecified osteoarthritis type      NEW MEDICATIONS STARTED DURING THIS VISIT:  Discharge Medication List as of 07/06/2015 10:55 PM    START taking these medications   Details  meloxicam (MOBIC) 7.5 MG tablet Take 1 tablet (7.5 mg total) by mouth daily., Starting 07/06/2015, Until Discontinued, Print    traMADol (ULTRAM) 50 MG tablet Take 1 tablet (50 mg total) by mouth every 6 (six) hours as needed., Starting 07/06/2015, Until Discontinued, Print            This chart was dictated using voice recognition software/Dragon. Despite best efforts to proofread, errors can occur which can change the meaning. Any change was purely unintentional.    Racheal Patches, PA-C 07/06/15 2308  Sharman Cheek,  MD 07/08/15 2130

## 2015-07-06 NOTE — ED Notes (Signed)
PA at bedside.

## 2015-07-08 DIAGNOSIS — I10 Essential (primary) hypertension: Secondary | ICD-10-CM | POA: Diagnosis not present

## 2015-07-08 DIAGNOSIS — Z0000 Encounter for general adult medical examination without abnormal findings: Secondary | ICD-10-CM | POA: Diagnosis not present

## 2015-07-08 DIAGNOSIS — M25562 Pain in left knee: Secondary | ICD-10-CM | POA: Diagnosis not present

## 2015-07-08 DIAGNOSIS — M25561 Pain in right knee: Secondary | ICD-10-CM | POA: Diagnosis not present

## 2015-07-08 DIAGNOSIS — K219 Gastro-esophageal reflux disease without esophagitis: Secondary | ICD-10-CM | POA: Diagnosis not present

## 2015-07-08 DIAGNOSIS — G40909 Epilepsy, unspecified, not intractable, without status epilepticus: Secondary | ICD-10-CM | POA: Diagnosis not present

## 2015-10-31 DIAGNOSIS — E559 Vitamin D deficiency, unspecified: Secondary | ICD-10-CM | POA: Diagnosis not present

## 2015-10-31 DIAGNOSIS — R413 Other amnesia: Secondary | ICD-10-CM | POA: Diagnosis not present

## 2015-10-31 DIAGNOSIS — M25562 Pain in left knee: Secondary | ICD-10-CM | POA: Diagnosis not present

## 2015-10-31 DIAGNOSIS — G40909 Epilepsy, unspecified, not intractable, without status epilepticus: Secondary | ICD-10-CM | POA: Diagnosis not present

## 2015-10-31 DIAGNOSIS — I10 Essential (primary) hypertension: Secondary | ICD-10-CM | POA: Diagnosis not present

## 2015-10-31 DIAGNOSIS — M25561 Pain in right knee: Secondary | ICD-10-CM | POA: Diagnosis not present

## 2015-10-31 DIAGNOSIS — E785 Hyperlipidemia, unspecified: Secondary | ICD-10-CM | POA: Diagnosis not present

## 2015-11-08 DIAGNOSIS — E538 Deficiency of other specified B group vitamins: Secondary | ICD-10-CM | POA: Diagnosis not present

## 2015-11-22 DIAGNOSIS — E538 Deficiency of other specified B group vitamins: Secondary | ICD-10-CM | POA: Diagnosis not present

## 2015-11-29 DIAGNOSIS — E538 Deficiency of other specified B group vitamins: Secondary | ICD-10-CM | POA: Diagnosis not present

## 2015-12-06 DIAGNOSIS — E538 Deficiency of other specified B group vitamins: Secondary | ICD-10-CM | POA: Diagnosis not present

## 2016-01-03 DIAGNOSIS — E538 Deficiency of other specified B group vitamins: Secondary | ICD-10-CM | POA: Diagnosis not present

## 2016-01-10 DIAGNOSIS — E538 Deficiency of other specified B group vitamins: Secondary | ICD-10-CM | POA: Diagnosis not present

## 2016-06-03 ENCOUNTER — Inpatient Hospital Stay
Admission: EM | Admit: 2016-06-03 | Discharge: 2016-06-08 | DRG: 689 | Disposition: A | Discharge status: Home Health | Payer: Medicare PPO | Attending: Specialist | Admitting: Specialist

## 2016-06-03 ENCOUNTER — Emergency Department: Payer: Medicare PPO

## 2016-06-03 ENCOUNTER — Encounter: Payer: Self-pay | Admitting: Emergency Medicine

## 2016-06-03 DIAGNOSIS — Z8249 Family history of ischemic heart disease and other diseases of the circulatory system: Secondary | ICD-10-CM

## 2016-06-03 DIAGNOSIS — I10 Essential (primary) hypertension: Secondary | ICD-10-CM | POA: Diagnosis present

## 2016-06-03 DIAGNOSIS — G9340 Encephalopathy, unspecified: Secondary | ICD-10-CM | POA: Diagnosis present

## 2016-06-03 DIAGNOSIS — E785 Hyperlipidemia, unspecified: Secondary | ICD-10-CM | POA: Diagnosis present

## 2016-06-03 DIAGNOSIS — I509 Heart failure, unspecified: Secondary | ICD-10-CM | POA: Clinically undetermined

## 2016-06-03 DIAGNOSIS — N179 Acute kidney failure, unspecified: Secondary | ICD-10-CM | POA: Diagnosis present

## 2016-06-03 DIAGNOSIS — Z79899 Other long term (current) drug therapy: Secondary | ICD-10-CM

## 2016-06-03 DIAGNOSIS — F0390 Unspecified dementia without behavioral disturbance: Secondary | ICD-10-CM | POA: Diagnosis present

## 2016-06-03 DIAGNOSIS — M1990 Unspecified osteoarthritis, unspecified site: Secondary | ICD-10-CM | POA: Diagnosis present

## 2016-06-03 DIAGNOSIS — N390 Urinary tract infection, site not specified: Secondary | ICD-10-CM | POA: Diagnosis not present

## 2016-06-03 DIAGNOSIS — I110 Hypertensive heart disease with heart failure: Secondary | ICD-10-CM | POA: Diagnosis present

## 2016-06-03 DIAGNOSIS — G40909 Epilepsy, unspecified, not intractable, without status epilepticus: Secondary | ICD-10-CM

## 2016-06-03 DIAGNOSIS — K219 Gastro-esophageal reflux disease without esophagitis: Secondary | ICD-10-CM | POA: Diagnosis present

## 2016-06-03 HISTORY — DX: Heart failure, unspecified: I50.9

## 2016-06-03 HISTORY — DX: Gastro-esophageal reflux disease without esophagitis: K21.9

## 2016-06-03 LAB — COMPREHENSIVE METABOLIC PANEL
ALK PHOS: 42 U/L (ref 38–126)
ALT: 23 U/L (ref 14–54)
ANION GAP: 8 (ref 5–15)
AST: 23 U/L (ref 15–41)
Albumin: 3.6 g/dL (ref 3.5–5.0)
BUN: 20 mg/dL (ref 6–20)
CALCIUM: 8.8 mg/dL — AB (ref 8.9–10.3)
CO2: 25 mmol/L (ref 22–32)
Chloride: 101 mmol/L (ref 101–111)
Creatinine, Ser: 1.31 mg/dL — ABNORMAL HIGH (ref 0.44–1.00)
GFR calc non Af Amer: 38 mL/min — ABNORMAL LOW (ref >60)
GFR, EST AFRICAN AMERICAN: 44 mL/min — AB (ref >60)
Glucose, Bld: 133 mg/dL — ABNORMAL HIGH (ref 65–99)
Potassium: 3 mmol/L — ABNORMAL LOW (ref 3.5–5.1)
Sodium: 134 mmol/L — ABNORMAL LOW (ref 135–145)
TOTAL PROTEIN: 7.1 g/dL (ref 6.5–8.1)
Total Bilirubin: 0.6 mg/dL (ref 0.3–1.2)

## 2016-06-03 LAB — URINALYSIS, COMPLETE (UACMP) WITH MICROSCOPIC
Bilirubin Urine: NEGATIVE
Glucose, UA: NEGATIVE mg/dL
KETONES UR: NEGATIVE mg/dL
Nitrite: NEGATIVE
PH: 5 (ref 5.0–8.0)
PROTEIN: 30 mg/dL — AB
Specific Gravity, Urine: 1.013 (ref 1.005–1.030)

## 2016-06-03 LAB — APTT: APTT: 33 s (ref 24–36)

## 2016-06-03 LAB — CBC WITH DIFFERENTIAL/PLATELET
BASOS ABS: 0 10*3/uL (ref 0–0.1)
BASOS PCT: 0 %
EOS ABS: 0 10*3/uL (ref 0–0.7)
Eosinophils Relative: 0 %
HCT: 32.2 % — ABNORMAL LOW (ref 35.0–47.0)
Hemoglobin: 11.2 g/dL — ABNORMAL LOW (ref 12.0–16.0)
Lymphocytes Relative: 7 %
Lymphs Abs: 0.7 10*3/uL — ABNORMAL LOW (ref 1.0–3.6)
MCH: 31.9 pg (ref 26.0–34.0)
MCHC: 34.7 g/dL (ref 32.0–36.0)
MCV: 91.8 fL (ref 80.0–100.0)
Monocytes Absolute: 0.8 10*3/uL (ref 0.2–0.9)
Monocytes Relative: 7 %
Neutro Abs: 9.4 10*3/uL — ABNORMAL HIGH (ref 1.4–6.5)
Neutrophils Relative %: 86 %
Platelets: 256 10*3/uL (ref 150–440)
RBC: 3.51 MIL/uL — AB (ref 3.80–5.20)
RDW: 13.8 % (ref 11.5–14.5)
WBC: 10.9 10*3/uL (ref 3.6–11.0)

## 2016-06-03 LAB — PROTIME-INR
INR: 1
Prothrombin Time: 13.2 seconds (ref 11.4–15.2)

## 2016-06-03 LAB — LACTIC ACID, PLASMA: Lactic Acid, Venous: 1.4 mmol/L (ref 0.5–1.9)

## 2016-06-03 LAB — LIPASE, BLOOD: LIPASE: 24 U/L (ref 11–51)

## 2016-06-03 LAB — TROPONIN I

## 2016-06-03 LAB — AMMONIA: Ammonia: 19 umol/L (ref 9–35)

## 2016-06-03 MED ORDER — IOPAMIDOL (ISOVUE-300) INJECTION 61%
30.0000 mL | Freq: Once | INTRAVENOUS | Status: AC | PRN
  Administered 2016-06-03: 30 mL via ORAL

## 2016-06-03 MED ORDER — SODIUM CHLORIDE 0.9 % IV BOLUS (SEPSIS)
1000.0000 mL | Freq: Once | INTRAVENOUS | Status: AC
  Administered 2016-06-03: 1000 mL via INTRAVENOUS

## 2016-06-03 MED ORDER — VANCOMYCIN HCL IN DEXTROSE 1-5 GM/200ML-% IV SOLN
1000.0000 mg | Freq: Once | INTRAVENOUS | Status: AC
  Administered 2016-06-03: 1000 mg via INTRAVENOUS
  Filled 2016-06-03: qty 200

## 2016-06-03 MED ORDER — PIPERACILLIN-TAZOBACTAM 3.375 G IVPB
3.3750 g | Freq: Three times a day (TID) | INTRAVENOUS | Status: DC
Start: 2016-06-04 — End: 2016-06-04
  Administered 2016-06-04 (×2): 3.375 g via INTRAVENOUS
  Filled 2016-06-03 (×2): qty 50

## 2016-06-03 MED ORDER — VANCOMYCIN HCL IN DEXTROSE 1-5 GM/200ML-% IV SOLN
1000.0000 mg | INTRAVENOUS | Status: DC
Start: 2016-06-04 — End: 2016-06-04
  Administered 2016-06-04: 1000 mg via INTRAVENOUS
  Filled 2016-06-03: qty 200

## 2016-06-03 MED ORDER — IOPAMIDOL (ISOVUE-300) INJECTION 61%
75.0000 mL | Freq: Once | INTRAVENOUS | Status: AC | PRN
  Administered 2016-06-03: 75 mL via INTRAVENOUS

## 2016-06-03 MED ORDER — PIPERACILLIN-TAZOBACTAM 3.375 G IVPB 30 MIN
3.3750 g | Freq: Once | INTRAVENOUS | Status: AC
  Administered 2016-06-03: 3.375 g via INTRAVENOUS
  Filled 2016-06-03: qty 50

## 2016-06-03 NOTE — ED Notes (Signed)
Patient transported to CT 

## 2016-06-03 NOTE — ED Provider Notes (Signed)
[facility] Emergency Department Provider Note  ____________________________________________  Time seen: Approximately 6:12 PM  I have reviewed the triage vital signs and the nursing notes.   HISTORY  Chief Complaint Altered Mental Status Level 5 caveat:  Portions of the history and physical were unable to be obtained due to the patient's acute illness and altered mental status    HPI Hannah Gordon is a 82 y.o. female who presents with altered mental status and fever. Code sepsis initiated upon initial assessment. Patient is unable to provide any history or follow commands.     Past Medical History:  Diagnosis Date  . Arthritis   . CHF (congestive heart failure) (HCC)   . Hyperlipemia   . Hypertension   . Seizures (HCC)      There are no active problems to display for this patient.    Past Surgical History:  Procedure Laterality Date  . BACK SURGERY    . FRACTURE SURGERY       Prior to Admission medications   Medication Sig Start Date End Date Taking? Authorizing Provider  losartan-hydrochlorothiazide (HYZAAR) 100-25 MG tablet Take 1 tablet by mouth daily.   Yes Historical Provider, MD  meloxicam (MOBIC) 7.5 MG tablet Take 1 tablet (7.5 mg total) by mouth daily. Patient taking differently: Take 7.5 mg by mouth 2 (two) times daily.  07/06/15  Yes Christiane Ha D Cuthriell, PA-C  omeprazole (PRILOSEC) 20 MG capsule Take 20 mg by mouth daily.   Yes Historical Provider, MD  phenytoin (DILANTIN) 100 MG ER capsule Take 200 mg by mouth 2 (two) times daily.   Yes Historical Provider, MD  pravastatin (PRAVACHOL) 20 MG tablet Take 20 mg by mouth daily.   Yes Historical Provider, MD  traMADol (ULTRAM) 50 MG tablet Take 1 tablet (50 mg total) by mouth every 6 (six) hours as needed. Patient not taking: Reported on 06/03/2016 07/06/15   Delorise Royals Cuthriell, PA-C     Allergies Patient has no known allergies.   No family history on file.  Social  History Social History  Substance Use Topics  . Smoking status: Never Smoker  . Smokeless tobacco: Never Used  . Alcohol use No    Review of Systems Unable to obtain due to altered mental status  ____________________________________________   PHYSICAL EXAM:  VITAL SIGNS: ED Triage Vitals  Enc Vitals Group     BP 06/03/16 1810 (!) 147/87     Pulse Rate 06/03/16 1806 94     Resp 06/03/16 1810 20     Temp 06/03/16 1806 (!) 100.8 F (38.2 C)     Temp Source 06/03/16 1806 Axillary     SpO2 06/03/16 1810 100 %     Weight 06/03/16 1808 186 lb (84.4 kg)     Height 06/03/16 1808 5\' 4"  (1.626 m)     Head Circumference --      Peak Flow --      Pain Score --      Pain Loc --      Pain Edu? --      Excl. in GC? --     Vital signs reviewed, nursing assessments reviewed.   Constitutional:   Awake and confused.. Ill-appearing. Eyes:   No scleral icterus. No conjunctival pallor. PERRL. EOMI.  No nystagmus. ENT   Head:   Normocephalic and atraumatic.   Nose:   No congestion/rhinnorhea. No septal hematoma   Mouth/Throat:   Dry mucous membranes, no pharyngeal erythema. No peritonsillar mass.  Neck:   No stridor. No SubQ emphysema. No meningismus. Hematological/Lymphatic/Immunilogical:   No cervical lymphadenopathy. Cardiovascular:   RRR. Symmetric bilateral radial and DP pulses.  No murmurs.  Respiratory:   Normal respiratory effort without tachypnea nor retractions. Breath sounds are clear and equal bilaterally. No wheezes/rales/rhonchi. Gastrointestinal:   Soft with generalized tenderness. Non distended. There is no CVA tenderness.  No rebound, rigidity, or guarding. Genitourinary:   deferred Musculoskeletal:   Normal range of motion in all extremities. No joint effusions.  No lower extremity tenderness.  No edema. Neurologic:   No coherent speech during my evaluation.  CN 2-10 normal. Motor grossly intact moving all extremities. .  Skin:    Skin is warm, dry and  intact. No rash noted.  No petechiae, purpura, or bullae. Appears jaundiced  ____________________________________________    LABS (pertinent positives/negatives) (all labs ordered are listed, but only abnormal results are displayed) Labs Reviewed  COMPREHENSIVE METABOLIC PANEL - Abnormal; Notable for the following:       Result Value   Sodium 134 (*)    Potassium 3.0 (*)    Glucose, Bld 133 (*)    Creatinine, Ser 1.31 (*)    Calcium 8.8 (*)    GFR calc non Af Amer 38 (*)    GFR calc Af Amer 44 (*)    All other components within normal limits  CBC WITH DIFFERENTIAL/PLATELET - Abnormal; Notable for the following:    RBC 3.51 (*)    Hemoglobin 11.2 (*)    HCT 32.2 (*)    Neutro Abs 9.4 (*)    Lymphs Abs 0.7 (*)    All other components within normal limits  URINALYSIS, COMPLETE (UACMP) WITH MICROSCOPIC - Abnormal; Notable for the following:    Color, Urine YELLOW (*)    APPearance CLOUDY (*)    Hgb urine dipstick SMALL (*)    Protein, ur 30 (*)    Leukocytes, UA LARGE (*)    Bacteria, UA FEW (*)    Squamous Epithelial / LPF 0-5 (*)    All other components within normal limits  CULTURE, BLOOD (ROUTINE X 2)  CULTURE, BLOOD (ROUTINE X 2)  URINE CULTURE  LACTIC ACID, PLASMA  LIPASE, BLOOD  TROPONIN I  APTT  PROTIME-INR  AMMONIA  LACTIC ACID, PLASMA   ____________________________________________   EKG  Interpreted by me Sinus rhythm rate of 96, normal axis intervals QRS ST segments and T waves  ____________________________________________    RADIOLOGY  Ct Abdomen Pelvis W Contrast  Result Date: 06/03/2016 CLINICAL DATA:  Altered mental status, vomiting, sepsis, generalized abdominal pain EXAM: CT ABDOMEN AND PELVIS WITH CONTRAST TECHNIQUE: Multidetector CT imaging of the abdomen and pelvis was performed using the standard protocol following bolus administration of intravenous contrast. CONTRAST:  75mL ISOVUE-300 IOPAMIDOL (ISOVUE-300) INJECTION 61% COMPARISON:   None. FINDINGS: Lower chest: Lung bases shows no acute findings. Small hiatal hernia. Hepatobiliary: There is a cyst in right hepatic lobe measures 7 mm. Cyst in left hepatic lobe laterally measures 1.6 cm. No calcified gallstones are noted within gallbladder. Pancreas: Unremarkable. No pancreatic ductal dilatation or surrounding inflammatory changes. Spleen: Normal in size without focal abnormality. Adrenals/Urinary Tract: There is a low density nodular mass left adrenal gland measures 4.2 cm. Although may represent adenoma further evaluation with MRI is recommended. Multiple bilateral parapelvic renal cysts are noted. There is a heterogeneous enhancing lesion lower pole posterior aspect of the right kidney measures 2.7 cm. Further evaluation with enhanced MRI is recommended to exclude renal  cell carcinoma. Largest central parapelvic cyst in midpole of the left kidney measures 2.5 cm. Delayed renal images shows bilateral renal symmetrical excretion. Bilateral visualized proximal ureter is unremarkable. Bilateral distal ureter is unremarkable. No thickening of urinary bladder wall. No urinary bladder filling defects. There is a cyst in lower pole anterior aspect of the left kidney measures 1.6 cm. Bilateral nonspecific perinephric stranding/fluid. Stomach/Bowel: There is no gastric outlet obstruction. No small bowel obstruction. No thickened or dilated small bowel loops. No pericecal inflammation. The terminal ileum is unremarkable. Moderate stool noted within cecum and right colon. The appendix is not identified. There is moderate stool and some colonic gas within proximal transverse colon. Some colonic gas noted in mid transverse colon and distal transverse colon. There is redundant transverse colon. Scattered diverticula are noted descending colon. No definite evidence of acute colitis or diverticulitis. Multiple diverticula are noted within proximal sigmoid colon. No evidence of acute colitis or distal  diverticulitis. No distal colonic obstruction. Vascular/Lymphatic: Atherosclerotic calcifications of abdominal aorta and iliac arteries are noted. No aortic aneurysm. No adenopathy. Reproductive: The uterus and ovaries are not identified probable surgically absent. Other: No ascites or free abdominal air. Musculoskeletal: Sagittal images of the spine shows diffuse osteopenia. Degenerative changes lower thoracic spine. No destructive bony lesions are noted. Facet degenerative changes are noted multilevel lumbar spine. IMPRESSION: 1. There is low-density nodular lesion left adrenal gland measures 4.1 cm. Although may represent adenoma further evaluation with MRI is warranted. 2. Bilateral renal parapelvic cysts are noted. There is solid appearing exophytic lesion in lower pole of the right kidney posteriorly measures 2.7 cm. Further correlation with enhanced MRI is recommended to exclude renal cell carcinoma. 3. No small bowel or colonic obstruction. No pericecal inflammation. The appendix is not identified. Moderate stool noted in right colon and proximal transverse colon. Colonic diverticula are noted descending colon and sigmoid colon. No definite evidence of acute colitis or diverticulitis. No distal colonic obstruction. 4. No ascites or free air. 5. Degenerative changes thoracolumbar spine. 6. Atherosclerotic calcifications of abdominal aorta and iliac arteries. 7. Small hiatal hernia. Electronically Signed   By: Natasha Mead M.D.   On: 06/03/2016 20:40   Dg Chest Port 1 View  Result Date: 06/03/2016 CLINICAL DATA:  Altered mental status with vomiting EXAM: PORTABLE CHEST 1 VIEW COMPARISON:  11/12/2013 FINDINGS: Mildly low lung volumes with bibasilar atelectasis. Borderline to mild cardiomegaly. No edema. No pneumothorax. Degenerative changes of the bilateral shoulders. IMPRESSION: Low lung volume with mild bibasilar atelectasis. Borderline to mild cardiomegaly without edema. Electronically Signed   By: Jasmine Pang M.D.   On: 06/03/2016 18:39    ____________________________________________   PROCEDURES Procedures CRITICAL CARE Performed by: Scotty Court, Rocklyn Mayberry   Total critical care time: 35 minutes  Critical care time was exclusive of separately billable procedures and treating other patients.  Critical care was necessary to treat or prevent imminent or life-threatening deterioration.  Critical care was time spent personally by me on the following activities: development of treatment plan with patient and/or surrogate as well as nursing, discussions with consultants, evaluation of patient's response to treatment, examination of patient, obtaining history from patient or surrogate, ordering and performing treatments and interventions, ordering and review of laboratory studies, ordering and review of radiographic studies, pulse oximetry and re-evaluation of patient's condition.  ____________________________________________   INITIAL IMPRESSION / ASSESSMENT AND PLAN / ED COURSE  Pertinent labs & imaging results that were available during my care of the patient were reviewed by me  and considered in my medical decision making (see chart for details).  Patient presents with sepsis, presumably due to intra-abdominal source but there is a lot of clinical uncertainty due to the patient's inability to participate in exam or provide any history whatsoever. We'll check labs chest x-ray CT abdomen pelvis, give vancomycin and Zosyn and IV fluids.     Clinical Course as of Jun 04 2126  Wed Jun 03, 2016  1934 Ua c/w UTI. With encephalopathy pt will need hospitalization until improvement. Will contact hospitalist after CT a/p.  WBC, UA: TOO NUMEROUS TO COUNT [PS]    Clinical Course User Index [PS] Sharman Cheek, MD     ----------------------------------------- 9:28 PM on 06/03/2016 -----------------------------------------  CT negative. Case discussed with  hospitalist.  ____________________________________________   FINAL CLINICAL IMPRESSION(S) / ED DIAGNOSES  Final diagnoses:  Lower urinary tract infectious disease  Encephalopathy      New Prescriptions   No medications on file     Portions of this note were generated with dragon dictation software. Dictation errors may occur despite best attempts at proofreading.    Sharman Cheek, MD 06/03/16 2128

## 2016-06-03 NOTE — H&P (Signed)
[facility] - [place] at [facility]   PATIENT NAME: Hannah Gordon    MR#:  [id number]  DATE OF BIRTH:  03-26-1934  DATE OF ADMISSION:  06/03/2016  PRIMARY CARE PHYSICIAN: Pcp Not In System   REQUESTING/REFERRING PHYSICIAN: Scotty Court, MD  CHIEF COMPLAINT:   Chief Complaint  Patient presents with  . Altered Mental Status    HISTORY OF PRESENT ILLNESS:  Hannah Gordon  is a 82 y.o. female who presents with Acute encephalopathy. Patient is unable to contribute to her history of present illness due to her confusion. Lab workup in the ED shows UTI. Hospitalists were called for further evaluation and treatment.  PAST MEDICAL HISTORY:   Past Medical History:  Diagnosis Date  . Arthritis   . CHF (congestive heart failure) (HCC)   . GERD (gastroesophageal reflux disease)   . Hyperlipemia   . Hypertension   . Seizures (HCC)     PAST SURGICAL HISTORY:   Past Surgical History:  Procedure Laterality Date  . BACK SURGERY    . FRACTURE SURGERY      SOCIAL HISTORY:   Social History  Substance Use Topics  . Smoking status: Never Smoker  . Smokeless tobacco: Never Used  . Alcohol use No    FAMILY HISTORY:   Family History  Problem Relation Age of Onset  . Lung cancer Mother   . Liver cancer Mother   . CAD Father   . Hypertension Father   . Stroke Father   . Diabetes Sister     DRUG ALLERGIES:  No Known Allergies  MEDICATIONS AT HOME:   Prior to Admission medications   Medication Sig Start Date End Date Taking? Authorizing Provider  losartan-hydrochlorothiazide (HYZAAR) 100-25 MG tablet Take 1 tablet by mouth daily.   Yes Historical Provider, MD  meloxicam (MOBIC) 7.5 MG tablet Take 1 tablet (7.5 mg total) by mouth daily. Patient taking differently: Take 7.5 mg by mouth 2 (two) times daily.  07/06/15  Yes Christiane Ha D Cuthriell, PA-C  omeprazole (PRILOSEC) 20 MG capsule Take 20 mg by mouth daily.   Yes Historical Provider, MD  phenytoin  (DILANTIN) 100 MG ER capsule Take 200 mg by mouth 2 (two) times daily.   Yes Historical Provider, MD  pravastatin (PRAVACHOL) 20 MG tablet Take 20 mg by mouth daily.   Yes Historical Provider, MD  traMADol (ULTRAM) 50 MG tablet Take 1 tablet (50 mg total) by mouth every 6 (six) hours as needed. Patient not taking: Reported on 06/03/2016 07/06/15   Delorise Royals Cuthriell, PA-C    REVIEW OF SYSTEMS:  Review of Systems  Unable to perform ROS: Acuity of condition     VITAL SIGNS:   Vitals:   06/03/16 1900 06/03/16 1909 06/03/16 1930 06/03/16 2000  BP: (!) 114/43  (!) 109/93 126/64  Pulse: 89  83 86  Resp: 13  (!) 22 18  Temp:      TempSrc:      SpO2: 95%  99% 98%  Weight:  79.7 kg (175 lb 11.2 oz)    Height:       Wt Readings from Last 3 Encounters:  06/03/16 79.7 kg (175 lb 11.2 oz)  07/06/15 90.7 kg (200 lb)    PHYSICAL EXAMINATION:  Physical Exam  Vitals reviewed. Constitutional: She appears well-developed and well-nourished. No distress.  HENT:  Head: Normocephalic and atraumatic.  Mouth/Throat: Oropharynx is clear and moist.  Eyes: Conjunctivae and EOM are normal. Pupils are equal, round, and reactive to light.  No scleral icterus.  Neck: Normal range of motion. Neck supple. No JVD present. No thyromegaly present.  Cardiovascular: Normal rate, regular rhythm and intact distal pulses.  Exam reveals no gallop and no friction rub.   No murmur heard. Respiratory: Effort normal and breath sounds normal. No respiratory distress. She has no wheezes. She has no rales.  GI: Soft. Bowel sounds are normal. She exhibits no distension. There is no tenderness.  Musculoskeletal: Normal range of motion. She exhibits no edema.  No arthritis, no gout  Lymphadenopathy:    She has no cervical adenopathy.  Neurological: She is alert. No cranial nerve deficit.  Unable to fully assess due to patient condition  Skin: Skin is warm and dry. No rash noted. No erythema.  Psychiatric:  Unable to  assess due to patient condition    LABORATORY PANEL:   CBC  Recent Labs Lab 06/03/16 1815  WBC 10.9  HGB 11.2*  HCT 32.2*  PLT 256   ------------------------------------------------------------------------------------------------------------------  Chemistries   Recent Labs Lab 06/03/16 1815  NA 134*  K 3.0*  CL 101  CO2 25  GLUCOSE 133*  BUN 20  CREATININE 1.31*  CALCIUM 8.8*  AST 23  ALT 23  ALKPHOS 42  BILITOT 0.6   ------------------------------------------------------------------------------------------------------------------  Cardiac Enzymes  Recent Labs Lab 06/03/16 1815  TROPONINI <0.03   ------------------------------------------------------------------------------------------------------------------  RADIOLOGY:  Ct Abdomen Pelvis W Contrast  Result Date: 06/03/2016 CLINICAL DATA:  Altered mental status, vomiting, sepsis, generalized abdominal pain EXAM: CT ABDOMEN AND PELVIS WITH CONTRAST TECHNIQUE: Multidetector CT imaging of the abdomen and pelvis was performed using the standard protocol following bolus administration of intravenous contrast. CONTRAST:  75mL ISOVUE-300 IOPAMIDOL (ISOVUE-300) INJECTION 61% COMPARISON:  None. FINDINGS: Lower chest: Lung bases shows no acute findings. Small hiatal hernia. Hepatobiliary: There is a cyst in right hepatic lobe measures 7 mm. Cyst in left hepatic lobe laterally measures 1.6 cm. No calcified gallstones are noted within gallbladder. Pancreas: Unremarkable. No pancreatic ductal dilatation or surrounding inflammatory changes. Spleen: Normal in size without focal abnormality. Adrenals/Urinary Tract: There is a low density nodular mass left adrenal gland measures 4.2 cm. Although may represent adenoma further evaluation with MRI is recommended. Multiple bilateral parapelvic renal cysts are noted. There is a heterogeneous enhancing lesion lower pole posterior aspect of the right kidney measures 2.7 cm. Further  evaluation with enhanced MRI is recommended to exclude renal cell carcinoma. Largest central parapelvic cyst in midpole of the left kidney measures 2.5 cm. Delayed renal images shows bilateral renal symmetrical excretion. Bilateral visualized proximal ureter is unremarkable. Bilateral distal ureter is unremarkable. No thickening of urinary bladder wall. No urinary bladder filling defects. There is a cyst in lower pole anterior aspect of the left kidney measures 1.6 cm. Bilateral nonspecific perinephric stranding/fluid. Stomach/Bowel: There is no gastric outlet obstruction. No small bowel obstruction. No thickened or dilated small bowel loops. No pericecal inflammation. The terminal ileum is unremarkable. Moderate stool noted within cecum and right colon. The appendix is not identified. There is moderate stool and some colonic gas within proximal transverse colon. Some colonic gas noted in mid transverse colon and distal transverse colon. There is redundant transverse colon. Scattered diverticula are noted descending colon. No definite evidence of acute colitis or diverticulitis. Multiple diverticula are noted within proximal sigmoid colon. No evidence of acute colitis or distal diverticulitis. No distal colonic obstruction. Vascular/Lymphatic: Atherosclerotic calcifications of abdominal aorta and iliac arteries are noted. No aortic aneurysm. No adenopathy. Reproductive: The uterus and ovaries are  not identified probable surgically absent. Other: No ascites or free abdominal air. Musculoskeletal: Sagittal images of the spine shows diffuse osteopenia. Degenerative changes lower thoracic spine. No destructive bony lesions are noted. Facet degenerative changes are noted multilevel lumbar spine. IMPRESSION: 1. There is low-density nodular lesion left adrenal gland measures 4.1 cm. Although may represent adenoma further evaluation with MRI is warranted. 2. Bilateral renal parapelvic cysts are noted. There is solid  appearing exophytic lesion in lower pole of the right kidney posteriorly measures 2.7 cm. Further correlation with enhanced MRI is recommended to exclude renal cell carcinoma. 3. No small bowel or colonic obstruction. No pericecal inflammation. The appendix is not identified. Moderate stool noted in right colon and proximal transverse colon. Colonic diverticula are noted descending colon and sigmoid colon. No definite evidence of acute colitis or diverticulitis. No distal colonic obstruction. 4. No ascites or free air. 5. Degenerative changes thoracolumbar spine. 6. Atherosclerotic calcifications of abdominal aorta and iliac arteries. 7. Small hiatal hernia. Electronically Signed   By: Natasha MeadLiviu  Pop M.D.   On: 06/03/2016 20:40   Dg Chest Port 1 View  Result Date: 06/03/2016 CLINICAL DATA:  Altered mental status with vomiting EXAM: PORTABLE CHEST 1 VIEW COMPARISON:  11/12/2013 FINDINGS: Mildly low lung volumes with bibasilar atelectasis. Borderline to mild cardiomegaly. No edema. No pneumothorax. Degenerative changes of the bilateral shoulders. IMPRESSION: Low lung volume with mild bibasilar atelectasis. Borderline to mild cardiomegaly without edema. Electronically Signed   By: Jasmine PangKim  Fujinaga M.D.   On: 06/03/2016 18:39    EKG:   Orders placed or performed during the hospital encounter of 06/03/16  . EKG 12-Lead  . EKG 12-Lead    IMPRESSION AND PLAN:  Principal Problem:   Acute encephalopathy - likely due to her UTI, expect improvement with treatment of the same, see below Active Problems:   UTI (urinary tract infection) - IV antibiotics, urine cultures sent from the ED   AKI (acute kidney injury) (HCC) - likely related to her UTI, gentle hydration, avoid nephrotoxins and monitor   HTN (hypertension) - stable, continue home meds   Seizure disorder (HCC) - continue home medications   GERD (gastroesophageal reflux disease) - home dose PPI  All the records are reviewed and case discussed with ED  provider. Management plans discussed with the patient and/or family.  DVT PROPHYLAXIS: SubQ heparin  GI PROPHYLAXIS: PPI  ADMISSION STATUS: Inpatient  CODE STATUS: Full Code Status History    This patient does not have a recorded code status. Please follow your organizational policy for patients in this situation.      TOTAL TIME TAKING CARE OF THIS PATIENT: 45 minutes.    Bernal Luhman FIELDING 06/03/2016, 9:44 PM  [facility]  Office  [phone]  CC: Primary care physician; Pcp Not In System

## 2016-06-03 NOTE — Progress Notes (Signed)
Pharmacy Antibiotic Note  Hannah DallasKatie B Gordon is a 82 y.o. female admitted on 06/03/2016 with sepsis.  Pharmacy has been consulted for vancomycin and Zosyn dosing.  Plan: DW 65kg  Vd 46L kei 0.034 hr-1  t1/2 20 hours Vancomycin 1 gram q 24 hours ordered with stacked dosing . Level before 5th dose. Goal trough 15-20.  Zosyn 3.375 grams q 8 hours ordered.  Height: 5\' 4"  (162.6 cm) Weight: 175 lb 11.2 oz (79.7 kg) IBW/kg (Calculated) : 54.7  Temp (24hrs), Avg:101.4 F (38.6 C), Min:100.8 F (38.2 C), Max:101.9 F (38.8 C)   Recent Labs Lab 06/03/16 1815  WBC 10.9  CREATININE 1.31*  LATICACIDVEN 1.4    Estimated Creatinine Clearance: 35.6 mL/min (A) (by C-G formula based on SCr of 1.31 mg/dL (H)).    No Known Allergies  Antimicrobials this admission: Vancomycin, Zosyn 3/21 >>    >>   Dose adjustments this admission:   Microbiology results: 3/21 BCx: pending 3/21 UCx: pending      3/21 UA: LE(+)  NO2(-)  WBC TNTC 3/21 CXR: bibasliar atelectasis Thank you for allowing pharmacy to be a part of this patient's care.  Montoya Watkin S 06/03/2016 11:08 PM

## 2016-06-03 NOTE — ED Notes (Signed)
Patient instructed on how to drink her contrast.  Family educated as well and they will continue to remind patient to drink contrast.

## 2016-06-03 NOTE — ED Triage Notes (Signed)
Ems pt from home with AMS and vomiting x2 (greenish/yellow bile). Pt not following commands , awake, warm to the touch. CBG 148

## 2016-06-04 LAB — BASIC METABOLIC PANEL
Anion gap: 7 (ref 5–15)
Anion gap: 7 (ref 5–15)
BUN: 15 mg/dL (ref 6–20)
BUN: 16 mg/dL (ref 6–20)
CALCIUM: 7.7 mg/dL — AB (ref 8.9–10.3)
CO2: 22 mmol/L (ref 22–32)
CO2: 22 mmol/L (ref 22–32)
Calcium: 7.9 mg/dL — ABNORMAL LOW (ref 8.9–10.3)
Chloride: 104 mmol/L (ref 101–111)
Chloride: 106 mmol/L (ref 101–111)
Creatinine, Ser: 0.95 mg/dL (ref 0.44–1.00)
Creatinine, Ser: 1.21 mg/dL — ABNORMAL HIGH (ref 0.44–1.00)
GFR calc Af Amer: >60 mL/min (ref >60)
GFR calc Af Amer: 48 mL/min — ABNORMAL LOW (ref >60)
GFR, EST NON AFRICAN AMERICAN: 55 mL/min — AB (ref >60)
GFR, EST NON AFRICAN AMERICAN: 41 mL/min — AB (ref >60)
GLUCOSE: 146 mg/dL — AB (ref 65–99)
GLUCOSE: 148 mg/dL — AB (ref 65–99)
POTASSIUM: 2.8 mmol/L — AB (ref 3.5–5.1)
Potassium: 2.2 mmol/L — CL (ref 3.5–5.1)
Sodium: 133 mmol/L — ABNORMAL LOW (ref 135–145)
Sodium: 135 mmol/L (ref 135–145)

## 2016-06-04 LAB — CBC
HCT: 29 % — ABNORMAL LOW (ref 35.0–47.0)
HEMOGLOBIN: 10.2 g/dL — AB (ref 12.0–16.0)
MCH: 32.3 pg (ref 26.0–34.0)
MCHC: 35.3 g/dL (ref 32.0–36.0)
MCV: 91.4 fL (ref 80.0–100.0)
Platelets: 211 10*3/uL (ref 150–440)
RBC: 3.17 MIL/uL — ABNORMAL LOW (ref 3.80–5.20)
RDW: 14 % (ref 11.5–14.5)
WBC: 14.3 10*3/uL — ABNORMAL HIGH (ref 3.6–11.0)

## 2016-06-04 LAB — PHENYTOIN LEVEL, TOTAL: Phenytoin Lvl: 8.8 ug/mL — ABNORMAL LOW (ref 10.0–20.0)

## 2016-06-04 LAB — MAGNESIUM: MAGNESIUM: 1.2 mg/dL — AB (ref 1.7–2.4)

## 2016-06-04 MED ORDER — SODIUM CHLORIDE 0.9 % IV SOLN
30.0000 meq | INTRAVENOUS | Status: AC
  Administered 2016-06-04 (×2): 30 meq via INTRAVENOUS
  Filled 2016-06-04 (×2): qty 15

## 2016-06-04 MED ORDER — HEPARIN SODIUM (PORCINE) 5000 UNIT/ML IJ SOLN
5000.0000 [IU] | Freq: Three times a day (TID) | INTRAMUSCULAR | Status: DC
  Administered 2016-06-04 – 2016-06-08 (×13): 5000 [IU] via SUBCUTANEOUS
  Filled 2016-06-04 (×12): qty 1

## 2016-06-04 MED ORDER — ACETAMINOPHEN 650 MG RE SUPP
650.0000 mg | Freq: Four times a day (QID) | RECTAL | Status: DC | PRN
  Administered 2016-06-04: 15:00:00 650 mg via RECTAL
  Filled 2016-06-04: qty 1

## 2016-06-04 MED ORDER — ONDANSETRON HCL 4 MG PO TABS: 4.0000 mg | ORAL_TABLET | Freq: Four times a day (QID) | ORAL | Status: DC | PRN

## 2016-06-04 MED ORDER — PHENYTOIN SODIUM EXTENDED 100 MG PO CAPS
200.0000 mg | ORAL_CAPSULE | Freq: Two times a day (BID) | ORAL | Status: DC
  Administered 2016-06-04 – 2016-06-08 (×10): 200 mg via ORAL
  Filled 2016-06-04 (×11): qty 2

## 2016-06-04 MED ORDER — ACETAMINOPHEN 325 MG PO TABS
650.0000 mg | ORAL_TABLET | Freq: Four times a day (QID) | ORAL | Status: DC | PRN
  Administered 2016-06-08: 650 mg via ORAL
  Filled 2016-06-04: qty 2

## 2016-06-04 MED ORDER — DEXTROSE 5 % IV SOLN
1.0000 g | INTRAVENOUS | Status: DC
  Administered 2016-06-04 – 2016-06-08 (×5): 1 g via INTRAVENOUS
  Filled 2016-06-04 (×6): qty 10

## 2016-06-04 MED ORDER — MAGNESIUM SULFATE 4 GM/100ML IV SOLN
4.0000 g | Freq: Once | INTRAVENOUS | Status: AC
  Administered 2016-06-04: 4 g via INTRAVENOUS
  Filled 2016-06-04: qty 100

## 2016-06-04 MED ORDER — PANTOPRAZOLE SODIUM 40 MG PO TBEC
40.0000 mg | DELAYED_RELEASE_TABLET | Freq: Every day | ORAL | Status: DC
  Administered 2016-06-05 – 2016-06-08 (×4): 40 mg via ORAL
  Filled 2016-06-04 (×6): qty 1

## 2016-06-04 MED ORDER — ONDANSETRON HCL 4 MG/2ML IJ SOLN
4.0000 mg | Freq: Four times a day (QID) | INTRAMUSCULAR | Status: DC | PRN
  Administered 2016-06-04: 4 mg via INTRAVENOUS
  Filled 2016-06-04: qty 2

## 2016-06-04 MED ORDER — SODIUM CHLORIDE 0.9 % IV SOLN
INTRAVENOUS | Status: DC
  Administered 2016-06-04: 04:00:00 via INTRAVENOUS

## 2016-06-04 MED ORDER — SODIUM CHLORIDE 0.9 % IV SOLN
INTRAVENOUS | Status: DC
  Administered 2016-06-05: 13:00:00 via INTRAVENOUS

## 2016-06-04 MED ORDER — POTASSIUM CHLORIDE 2 MEQ/ML IV SOLN
30.0000 meq | INTRAVENOUS | Status: AC
  Administered 2016-06-04 (×2): 30 meq via INTRAVENOUS
  Filled 2016-06-04 (×3): qty 15

## 2016-06-04 MED ORDER — VANCOMYCIN HCL IN DEXTROSE 1-5 GM/200ML-% IV SOLN
1000.0000 mg | INTRAVENOUS | Status: DC
  Filled 2016-06-04: qty 200

## 2016-06-04 MED ORDER — POTASSIUM CHLORIDE CRYS ER 20 MEQ PO TBCR
40.0000 meq | EXTENDED_RELEASE_TABLET | Freq: Once | ORAL | Status: DC
  Filled 2016-06-04 (×2): qty 2

## 2016-06-04 MED ORDER — PRAVASTATIN SODIUM 20 MG PO TABS
20.0000 mg | ORAL_TABLET | Freq: Every day | ORAL | Status: DC
  Administered 2016-06-05 – 2016-06-08 (×4): 20 mg via ORAL
  Filled 2016-06-04 (×6): qty 1

## 2016-06-04 NOTE — Progress Notes (Signed)
Pharmacy Antibiotic Note  Hannah Gordon is a 82 y.o. female admitted on 06/03/2016 with sepsis.  Pharmacy has been consulted for vancomycin and Zosyn dosing.  Plan: SCr has improved to 0.95 mg/dL. Recalculated Ke 0.045 hr-1, T1/2 15.4 hr. Increase vancomycin to 1 gm IV Q18H and check trough 3/24 at 1330. Predicted trough 18 mcg/mL. Pharmacy will continue to follow and adjust as needed to maintain trough 15 to 20 mcg/mL.  Zosyn 3.375 grams q 8 hours ordered.  Height: 5\' 4"  (162.6 cm) Weight: 175 lb 11.2 oz (79.7 kg) IBW/kg (Calculated) : 54.7  Temp (24hrs), Avg:99.9 F (37.7 C), Min:98.1 F (36.7 C), Max:101.9 F (38.8 C)   Recent Labs Lab 06/03/16 1815 06/04/16 0659  WBC 10.9 14.3*  CREATININE 1.31* 0.95  LATICACIDVEN 1.4  --     Estimated Creatinine Clearance: 49 mL/min (by C-G formula based on SCr of 0.95 mg/dL).    No Known Allergies  Antimicrobials this admission: Vancomycin, Zosyn 3/21 >>    >>   Dose adjustments this admission:   Microbiology results: 3/21 BCx: pending 3/21 UCx: pending      3/21 UA: LE(+)  NO2(-)  WBC TNTC 3/21 CXR: bibasliar atelectasis Thank you for allowing pharmacy to be a part of this patient's care.  Carola FrostNathan A Klint Lezcano, Pharm.D., BCPS Clinical Pharmacist 06/04/2016 8:39 AM

## 2016-06-04 NOTE — Plan of Care (Signed)
Problem: Education: Goal: Knowledge of [facility] Education information/materials will improve Outcome: Not Progressing Pt Confused. Alert to self only.   Problem: Physical Regulation: Goal: Ability to maintain clinical measurements within normal limits will improve Outcome: Progressing Potassium serum 2.2. Potassium IV infusing.Pt refused potassium PO as well as other PO meds. MD is aware. Magnesium serum 1.2. IV magnesium given. Oral temp 101.7.  Tylenol supp given with improvement.  Problem: Fluid Volume: Goal: Ability to maintain a balanced intake and output will improve Outcome: Not Progressing Poor PO intake. Pt refuses meals and PO meds. IVF infusing. Zofran given once for nausea with improvement. Pt incontinent. In&out cath not done due to pt being able to void. Last bladder scan post void 188nl.  Problem: Nutrition: Goal: Adequate nutrition will be maintained Outcome: Not Progressing As above.

## 2016-06-04 NOTE — Progress Notes (Signed)
MEDICATION RELATED CONSULT NOTE - INITIAL   Pharmacy Consult for potassium replacement Indication: hypokalemia  No Known Allergies  Patient Measurements: Height: 5\' 4"  (162.6 cm) Weight: 175 lb 11.2 oz (79.7 kg) IBW/kg (Calculated) : 54.7 Adjusted Body Weight:   Vital Signs: Temp: 100 F (37.8 C) (03/22 1535) Temp Source: Oral (03/22 1535) BP: 140/39 (03/22 1235) Pulse Rate: 138 (03/22 1235) Intake/Output from previous day: 03/21 0701 - 03/22 0700 In: 50 [IV Piggyback:50] Out: -  Intake/Output from this shift: No intake/output data recorded.  Labs:  Recent Labs  06/03/16 1815 06/04/16 0659 06/04/16 1745  WBC 10.9 14.3*  --   HGB 11.2* 10.2*  --   HCT 32.2* 29.0*  --   PLT 256 211  --   APTT 33  --   --   CREATININE 1.31* 0.95 1.21*  MG  --  1.2*  --   ALBUMIN 3.6  --   --   PROT 7.1  --   --   AST 23  --   --   ALT 23  --   --   ALKPHOS 42  --   --   BILITOT 0.6  --   --    Estimated Creatinine Clearance: 38.5 mL/min (A) (by C-G formula based on SCr of 1.21 mg/dL (H)).   Microbiology: Recent Results (from the past 720 hour(s))  Blood Culture (routine x 2)     Status: None (Preliminary result)   Collection Time: 06/03/16  6:16 PM  Result Value Ref Range Status   Specimen Description BLOOD RIGHT ASSIST CONTROL  Final   Special Requests BOTTLES DRAWN AEROBIC AND ANAEROBIC BCAV  Final   Culture NO GROWTH < 24 HOURS  Final   Report Status PENDING  Incomplete  Blood Culture (routine x 2)     Status: None (Preliminary result)   Collection Time: 06/03/16  6:28 PM  Result Value Ref Range Status   Specimen Description BLOOD LEFT HAND  Final   Special Requests BOTTLES DRAWN AEROBIC AND ANAEROBIC BCAV  Final   Culture NO GROWTH < 24 HOURS  Final   Report Status PENDING  Incomplete    Medical History: Past Medical History:  Diagnosis Date  . Arthritis   . CHF (congestive heart failure) (HCC)   . GERD (gastroesophageal reflux disease)   . Hyperlipemia   .  Hypertension   . Seizures (HCC)     Medications:  Infusions:  . sodium chloride 75 mL/hr at 06/04/16 1236    Assessment: 81 yof cc AMS, ED workup shows UTI, started on broad spectrum ABX. PMH arthritis, CHF, GERD, HLD, HTN, seizures. Potassium in ED was 3, decreased to 2.2 this AM after gentle hydration. Pharmacy consulted to replace potassium.  Goal of Therapy:  K 3.5 to 5  Plan:  K level @ 2.8. Second bag of KCL 30MEQ/25665ml is currently running. Add on Mg resulted at 1.2. Mg 4g IV was given.  Pt refused to take oral K. Will give another 30MEQ IV x 2 and recheck K and Mg at 0500.   Olene FlossMelissa D Desia Saban, Pharm.D., BCPS Clinical Pharmacist 06/04/2016,6:52 PM

## 2016-06-04 NOTE — Progress Notes (Addendum)
MEDICATION RELATED CONSULT NOTE - INITIAL   Pharmacy Consult for potassium replacement Indication: hypokalemia  No Known Allergies  Patient Measurements: Height: 5\' 4"  (162.6 cm) Weight: 175 lb 11.2 oz (79.7 kg) IBW/kg (Calculated) : 54.7 Adjusted Body Weight:   Vital Signs: Temp: 98.1 F (36.7 C) (03/22 0338) Temp Source: Oral (03/22 0007) BP: 115/64 (03/22 0338) Pulse Rate: 87 (03/22 0338) Intake/Output from previous day: 03/21 0701 - 03/22 0700 In: 50 [IV Piggyback:50] Out: -  Intake/Output from this shift: No intake/output data recorded.  Labs:  Recent Labs  06/03/16 1815 06/04/16 0659  WBC 10.9 14.3*  HGB 11.2* 10.2*  HCT 32.2* 29.0*  PLT 256 211  APTT 33  --   CREATININE 1.31* 0.95  ALBUMIN 3.6  --   PROT 7.1  --   AST 23  --   ALT 23  --   ALKPHOS 42  --   BILITOT 0.6  --    Estimated Creatinine Clearance: 49 mL/min (by C-G formula based on SCr of 0.95 mg/dL).   Microbiology: Recent Results (from the past 720 hour(s))  Blood Culture (routine x 2)     Status: None (Preliminary result)   Collection Time: 06/03/16  6:16 PM  Result Value Ref Range Status   Specimen Description BLOOD RIGHT ASSIST CONTROL  Final   Special Requests BOTTLES DRAWN AEROBIC AND ANAEROBIC BCAV  Final   Culture NO GROWTH < 24 HOURS  Final   Report Status PENDING  Incomplete  Blood Culture (routine x 2)     Status: None (Preliminary result)   Collection Time: 06/03/16  6:28 PM  Result Value Ref Range Status   Specimen Description BLOOD LEFT HAND  Final   Special Requests BOTTLES DRAWN AEROBIC AND ANAEROBIC BCAV  Final   Culture NO GROWTH < 24 HOURS  Final   Report Status PENDING  Incomplete    Medical History: Past Medical History:  Diagnosis Date  . Arthritis   . CHF (congestive heart failure) (HCC)   . GERD (gastroesophageal reflux disease)   . Hyperlipemia   . Hypertension   . Seizures (HCC)     Medications:  Infusions:  . sodium chloride 75 mL/hr at 06/04/16  0408    Assessment: 81 yof cc AMS, ED workup shows UTI, started on broad spectrum ABX. PMH arthritis, CHF, GERD, HLD, HTN, seizures. Potassium in ED was 3, decreased to 2.2 this AM after gentle hydration. Pharmacy consulted to replace potassium.  Goal of Therapy:  K 3.5 to 5  Plan:  Order add-on magnesium level. Give potassium 40 mEq po x 1 and potassium 30 mEq IV Q4H x 2 doses. Recheck BMP this evening.  0659 magnesium add-on resulted at 1.2. Dr. Allena KatzPatel would like pharmacy to replete that as well. Will give 4 gm IV x 1 and recheck with AM labs.   Carola FrostNathan A Jaimere Feutz, Pharm.D., BCPS Clinical Pharmacist 06/04/2016,8:16 AM

## 2016-06-04 NOTE — Progress Notes (Signed)
[facility] [place] [facility]   PATIENT NAME: Hannah FactorKatie Gordon    MR#:  [id number]  DATE OF BIRTH:  Oct 24, 1934  SUBJECTIVE:    REVIEW OF SYSTEMS:   ROS Tolerating Diet: Tolerating PT:   DRUG ALLERGIES:  No Known Allergies  VITALS:  Blood pressure (!) 140/39, pulse (!) 138, temperature 98.6 F (37 C), resp. rate 18, height 5\' 4"  (1.626 m), weight 79.7 kg (175 lb 11.2 oz), SpO2 100 %.  PHYSICAL EXAMINATION:   Physical Exam  GENERAL:  82 y.o.-year-old patient lying in the bed with no acute distress.  EYES: Pupils equal, round, reactive to light and accommodation. No scleral icterus. Extraocular muscles intact.  HEENT: Head atraumatic, normocephalic. Oropharynx and nasopharynx clear.  NECK:  Supple, no jugular venous distention. No thyroid enlargement, no tenderness.  LUNGS: Normal breath sounds bilaterally, no wheezing, rales, rhonchi. No use of accessory muscles of respiration.  CARDIOVASCULAR: S1, S2 normal. No murmurs, rubs, or gallops.  ABDOMEN: Soft, nontender, nondistended. Bowel sounds present. No organomegaly or mass.  EXTREMITIES: No cyanosis, clubbing or edema b/l.    NEUROLOGIC: Cranial nerves II through XII are intact. No focal Motor or sensory deficits b/l.   PSYCHIATRIC:  patient is alert and oriented x 3.  SKIN: No obvious rash, lesion, or ulcer.   LABORATORY PANEL:  CBC  Recent Labs Lab 06/04/16 0659  WBC 14.3*  HGB 10.2*  HCT 29.0*  PLT 211    Chemistries   Recent Labs Lab 06/03/16 1815 06/04/16 0659  NA 134* 133*  K 3.0* 2.2*  CL 101 104  CO2 25 22  GLUCOSE 133* 146*  BUN 20 15  CREATININE 1.31* 0.95  CALCIUM 8.8* 7.7*  MG  --  1.2*  AST 23  --   ALT 23  --   ALKPHOS 42  --   BILITOT 0.6  --    Cardiac Enzymes  Recent Labs Lab 06/03/16 1815  TROPONINI <0.03   RADIOLOGY:  Ct Abdomen Pelvis W Contrast  Result Date: 06/03/2016 CLINICAL DATA:  Altered mental status, vomiting, sepsis, generalized  abdominal pain EXAM: CT ABDOMEN AND PELVIS WITH CONTRAST TECHNIQUE: Multidetector CT imaging of the abdomen and pelvis was performed using the standard protocol following bolus administration of intravenous contrast. CONTRAST:  75mL ISOVUE-300 IOPAMIDOL (ISOVUE-300) INJECTION 61% COMPARISON:  None. FINDINGS: Lower chest: Lung bases shows no acute findings. Small hiatal hernia. Hepatobiliary: There is a cyst in right hepatic lobe measures 7 mm. Cyst in left hepatic lobe laterally measures 1.6 cm. No calcified gallstones are noted within gallbladder. Pancreas: Unremarkable. No pancreatic ductal dilatation or surrounding inflammatory changes. Spleen: Normal in size without focal abnormality. Adrenals/Urinary Tract: There is a low density nodular mass left adrenal gland measures 4.2 cm. Although may represent adenoma further evaluation with MRI is recommended. Multiple bilateral parapelvic renal cysts are noted. There is a heterogeneous enhancing lesion lower pole posterior aspect of the right kidney measures 2.7 cm. Further evaluation with enhanced MRI is recommended to exclude renal cell carcinoma. Largest central parapelvic cyst in midpole of the left kidney measures 2.5 cm. Delayed renal images shows bilateral renal symmetrical excretion. Bilateral visualized proximal ureter is unremarkable. Bilateral distal ureter is unremarkable. No thickening of urinary bladder wall. No urinary bladder filling defects. There is a cyst in lower pole anterior aspect of the left kidney measures 1.6 cm. Bilateral nonspecific perinephric stranding/fluid. Stomach/Bowel: There is no gastric outlet obstruction. No small bowel obstruction. No thickened or dilated small bowel  loops. No pericecal inflammation. The terminal ileum is unremarkable. Moderate stool noted within cecum and right colon. The appendix is not identified. There is moderate stool and some colonic gas within proximal transverse colon. Some colonic gas noted in mid  transverse colon and distal transverse colon. There is redundant transverse colon. Scattered diverticula are noted descending colon. No definite evidence of acute colitis or diverticulitis. Multiple diverticula are noted within proximal sigmoid colon. No evidence of acute colitis or distal diverticulitis. No distal colonic obstruction. Vascular/Lymphatic: Atherosclerotic calcifications of abdominal aorta and iliac arteries are noted. No aortic aneurysm. No adenopathy. Reproductive: The uterus and ovaries are not identified probable surgically absent. Other: No ascites or free abdominal air. Musculoskeletal: Sagittal images of the spine shows diffuse osteopenia. Degenerative changes lower thoracic spine. No destructive bony lesions are noted. Facet degenerative changes are noted multilevel lumbar spine. IMPRESSION: 1. There is low-density nodular lesion left adrenal gland measures 4.1 cm. Although may represent adenoma further evaluation with MRI is warranted. 2. Bilateral renal parapelvic cysts are noted. There is solid appearing exophytic lesion in lower pole of the right kidney posteriorly measures 2.7 cm. Further correlation with enhanced MRI is recommended to exclude renal cell carcinoma. 3. No small bowel or colonic obstruction. No pericecal inflammation. The appendix is not identified. Moderate stool noted in right colon and proximal transverse colon. Colonic diverticula are noted descending colon and sigmoid colon. No definite evidence of acute colitis or diverticulitis. No distal colonic obstruction. 4. No ascites or free air. 5. Degenerative changes thoracolumbar spine. 6. Atherosclerotic calcifications of abdominal aorta and iliac arteries. 7. Small hiatal hernia. Electronically Signed   By: Natasha Mead M.D.   On: 06/03/2016 20:40   Dg Chest Port 1 View  Result Date: 06/03/2016 CLINICAL DATA:  Altered mental status with vomiting EXAM: PORTABLE CHEST 1 VIEW COMPARISON:  11/12/2013 FINDINGS: Mildly low  lung volumes with bibasilar atelectasis. Borderline to mild cardiomegaly. No edema. No pneumothorax. Degenerative changes of the bilateral shoulders. IMPRESSION: Low lung volume with mild bibasilar atelectasis. Borderline to mild cardiomegaly without edema. Electronically Signed   By: Jasmine Pang M.D.   On: 06/03/2016 18:39   ASSESSMENT AND PLAN:  Anida Deol  is a 82 y.o. female who presents with Acute encephalopathy. Patient is unable to contribute to her history of present illness due to her confusion. Lab workup in the ED shows UTI  *Acute encephalopathy - likely due to her UTI, expect improvement with treatment of the same, see below -pt still quiet confused -Spoke with son who stays she is being seen with [facility] neurology clinic for evaluation of cognitive decline  *  UTI (urinary tract infection) - IV antibiotics, urine cultures sent from the ED -IV Rocephin  *  AKI (acute kidney injury) (HCC) - likely related to her UTI, gentle hydration, avoid nephrotoxins and monitor -Replace mag and potassium  * HTN (hypertension) - stable, continue home meds  * Seizure disorder (HCC) - continue home medications   * GERD (gastroesophageal reflux disease) - home dose PPI  PT to work with PT CM for d/c planning  Case discussed with Care Management/Social Worker. Management plans discussed with the patient, family and they are in agreement.  CODE STATUS: full  DVT Prophylaxis:lovenox  TOTAL TIME TAKING CARE OF THIS PATIENT: 30 minutes.  >50% time spent on counselling and coordination of care  POSSIBLE D/C IN 1-2 DAYS, DEPENDING ON CLINICAL CONDITION.  Note: This dictation was prepared with Dragon dictation along with smaller phrase  technology. Any transcriptional errors that result from this process are unintentional.  Ladina Shutters M.D on 06/04/2016 at 2:46 PM  Between 7am to 6pm - Pager - [phone]  After 6pm go to [email or web address] - password [email or web address]  [facility]   Office  [phone]  CC: Primary care physician; Pcp Not In System

## 2016-06-04 NOTE — Progress Notes (Signed)
PT Cancellation Note  Patient Details Name: Bennie DallasKatie B Shaul MRN: [id number] DOB: 04/15/1934   Cancelled Treatment:    Reason Eval/Treat Not Completed: Medical issues which prohibited therapy (Most recent K+ 2.2).  RN suggests to hold pt for now as pt is still receiving K+.  PT will continue to follow acutely and will attempt PT evaluation once pt more medically appropriate.    Encarnacion ChuAshley Trysten Bernard PT, DPT 06/04/2016, 3:25 PM

## 2016-06-05 LAB — BASIC METABOLIC PANEL
ANION GAP: 5 (ref 5–15)
ANION GAP: 5 (ref 5–15)
BUN: 14 mg/dL (ref 6–20)
BUN: 14 mg/dL (ref 6–20)
CALCIUM: 7.9 mg/dL — AB (ref 8.9–10.3)
CALCIUM: 7.9 mg/dL — AB (ref 8.9–10.3)
CO2: 21 mmol/L — ABNORMAL LOW (ref 22–32)
CO2: 21 mmol/L — ABNORMAL LOW (ref 22–32)
CREATININE: 0.96 mg/dL (ref 0.44–1.00)
Chloride: 110 mmol/L (ref 101–111)
Chloride: 110 mmol/L (ref 101–111)
Creatinine, Ser: 1.03 mg/dL — ABNORMAL HIGH (ref 0.44–1.00)
GFR, EST AFRICAN AMERICAN: 58 mL/min — AB (ref >60)
GFR, EST NON AFRICAN AMERICAN: 50 mL/min — AB (ref >60)
GFR, EST NON AFRICAN AMERICAN: 55 mL/min — AB (ref >60)
GLUCOSE: 113 mg/dL — AB (ref 65–99)
Glucose, Bld: 103 mg/dL — ABNORMAL HIGH (ref 65–99)
POTASSIUM: 3.2 mmol/L — AB (ref 3.5–5.1)
Potassium: 3.9 mmol/L (ref 3.5–5.1)
SODIUM: 136 mmol/L (ref 135–145)
SODIUM: 136 mmol/L (ref 135–145)

## 2016-06-05 LAB — MAGNESIUM: MAGNESIUM: 2.4 mg/dL (ref 1.7–2.4)

## 2016-06-05 MED ORDER — POTASSIUM CHLORIDE CRYS ER 20 MEQ PO TBCR
20.0000 meq | EXTENDED_RELEASE_TABLET | Freq: Two times a day (BID) | ORAL | Status: AC
  Administered 2016-06-05 – 2016-06-06 (×4): 20 meq via ORAL
  Filled 2016-06-05 (×4): qty 1

## 2016-06-05 MED ORDER — SODIUM CHLORIDE 0.9 % IV SOLN
30.0000 meq | INTRAVENOUS | Status: AC
  Administered 2016-06-05 (×2): 30 meq via INTRAVENOUS
  Filled 2016-06-05 (×2): qty 15

## 2016-06-05 NOTE — Progress Notes (Signed)
MEDICATION RELATED CONSULT NOTE - INITIAL   Pharmacy Consult for potassium replacement Indication: hypokalemia  No Known Allergies  Patient Measurements: Height: 5\' 4"  (162.6 cm) Weight: 175 lb 11.2 oz (79.7 kg) IBW/kg (Calculated) : 54.7 Adjusted Body Weight:   Vital Signs: Temp: 98.7 F (37.1 C) (03/23 1456) Temp Source: Oral (03/23 1456) BP: 123/55 (03/23 1456) Pulse Rate: 79 (03/23 1456) Intake/Output from previous day: 03/22 0701 - 03/23 0700 In: 1735 [I.V.:1155; IV Piggyback:580] Out: -  Intake/Output from this shift: No intake/output data recorded.  Labs:  Recent Labs  06/03/16 1815 06/04/16 0659 06/04/16 1745 06/05/16 0444 06/05/16 1759  WBC 10.9 14.3*  --   --   --   HGB 11.2* 10.2*  --   --   --   HCT 32.2* 29.0*  --   --   --   PLT 256 211  --   --   --   APTT 33  --   --   --   --   CREATININE 1.31* 0.95 1.21* 1.03* 0.96  MG  --  1.2*  --  2.4  --   ALBUMIN 3.6  --   --   --   --   PROT 7.1  --   --   --   --   AST 23  --   --   --   --   ALT 23  --   --   --   --   ALKPHOS 42  --   --   --   --   BILITOT 0.6  --   --   --   --    Estimated Creatinine Clearance: 48.5 mL/min (by C-G formula based on SCr of 0.96 mg/dL).   Microbiology: Recent Results (from the past 720 hour(s))  Blood Culture (routine x 2)     Status: None (Preliminary result)   Collection Time: 06/03/16  6:16 PM  Result Value Ref Range Status   Specimen Description BLOOD RIGHT ASSIST CONTROL  Final   Special Requests BOTTLES DRAWN AEROBIC AND ANAEROBIC BCAV  Final   Culture NO GROWTH 2 DAYS  Final   Report Status PENDING  Incomplete  Blood Culture (routine x 2)     Status: None (Preliminary result)   Collection Time: 06/03/16  6:28 PM  Result Value Ref Range Status   Specimen Description BLOOD LEFT HAND  Final   Special Requests BOTTLES DRAWN AEROBIC AND ANAEROBIC BCAV  Final   Culture NO GROWTH 2 DAYS  Final   Report Status PENDING  Incomplete  Urine culture     Status:  Abnormal (Preliminary result)   Collection Time: 06/03/16  6:39 PM  Result Value Ref Range Status   Specimen Description URINE, RANDOM  Final   Special Requests NONE  Final   Culture (A)  Final    >=100,000 COLONIES/mL ESCHERICHIA COLI SUSCEPTIBILITIES TO FOLLOW Performed at [facility], [address] [place], [place] [address]    Report Status PENDING  Incomplete    Medical History: Past Medical History:  Diagnosis Date  . Arthritis   . CHF (congestive heart failure) (HCC)   . GERD (gastroesophageal reflux disease)   . Hyperlipemia   . Hypertension   . Seizures (HCC)     Medications:  Infusions:    Assessment: 81 yof cc AMS, ED workup shows UTI, started on broad spectrum ABX. PMH arthritis, CHF, GERD, HLD, HTN, seizures. Potassium in ED was 3, decreased to 2.2  this AM after gentle hydration. Pharmacy consulted to replace potassium.  Goal of Therapy:  K 3.5 to 5  Plan:  K level @ 2.8. Second bag of KCL 30MEQ/243ml is currently running. Add on Mg resulted at 1.2. Mg 4g IV was given.  Pt refused to take oral K. Will give another IV x 2 and recheck K and Mg at 0500.   3/23 0444 K 3.2 - has increased after 120 meq IV potassium yesterday. Will give additional 30 mEq IV Q4H x 2 doses - intravenous due to patient refusal to take oral. Mg 2.4, Ca 7.9. Pharmacy will continue to follow and will switch to oral repletion as soon as possible.  3/23 1800 K=3.9 No supplementation needed. Will recheck all electrolytes in the AM  Kali Ambler D Shyah Cadmus, Pharm.D., BCPS Clinical Pharmacist 06/05/2016,7:09 PM

## 2016-06-05 NOTE — NC FL2 (Signed)
[place] MEDICAID FL2 LEVEL OF CARE SCREENING TOOL     IDENTIFICATION  Patient Name: Hannah Gordon Birthdate: Oct 02, 1934 Sex: female Admission Date (Current Location): 06/03/2016  [place]   Facility and Address:  [facility], [address], [place], [place] [address]      Provider Number: [id number]  Attending Physician Name and Address:  Houston SirenVivek J Sainani, MD  Relative Name and Phone Number:       Current Level of Care: Hospital Recommended Level of Care: Skilled Nursing Facility Prior Approval Number:    Date Approved/Denied:   PASRR Number:  [id number])  Discharge Plan: SNF    Current Diagnoses: Patient Active Problem List   Diagnosis Date Noted  . Acute encephalopathy 06/03/2016  . UTI (urinary tract infection) 06/03/2016  . HTN (hypertension) 06/03/2016  . GERD (gastroesophageal reflux disease) 06/03/2016  . Seizure disorder (HCC) 06/03/2016  . AKI (acute kidney injury) (HCC) 06/03/2016    Orientation RESPIRATION BLADDER Height & Weight     Self  Normal Incontinent Weight: 175 lb 11.2 oz (79.7 kg) Height:  5\' 4"  (162.6 cm)  BEHAVIORAL SYMPTOMS/MOOD NEUROLOGICAL BOWEL NUTRITION STATUS   (none) Convulsions/Seizures (history of seizures ) Continent Diet (Diet: Heart Healthy )  AMBULATORY STATUS COMMUNICATION OF NEEDS Skin   Extensive Assist Verbally Normal                       Personal Care Assistance Level of Assistance  Bathing, Feeding, Dressing Bathing Assistance: Limited assistance Feeding assistance: Independent Dressing Assistance: Limited assistance     Functional Limitations Info  Sight, Hearing, Speech Sight Info: Adequate Hearing Info: Adequate Speech Info: Adequate    SPECIAL CARE FACTORS FREQUENCY  PT (By licensed PT), OT (By licensed OT)     PT Frequency:  (5) OT Frequency:  (5)            Contractures      Additional Factors Info  Code Status, Allergies  Code Status Info:  (Full Code. ) Allergies Info:  (No Known Allergies. )           Current Medications (06/05/2016):  This is the current hospital active medication list Current Facility-Administered Medications  Medication Dose Route Frequency Provider Last Rate Last Dose  . acetaminophen (TYLENOL) tablet 650 mg  650 mg Oral Q6H PRN Oralia Manisavid Willis, MD       Or  . acetaminophen (TYLENOL) suppository 650 mg  650 mg Rectal Q6H PRN Oralia Manisavid Willis, MD   650 mg at 06/04/16 1450  . cefTRIAXone (ROCEPHIN) 1 g in dextrose 5 % 50 mL IVPB  1 g Intravenous Q24H Enedina FinnerSona Patel, MD   1 g at 06/04/16 1519  . heparin injection 5,000 Units  5,000 Units Subcutaneous Q8H Oralia Manisavid Willis, MD   5,000 Units at 06/05/16 0504  . ondansetron (ZOFRAN) tablet 4 mg  4 mg Oral Q6H PRN Oralia Manisavid Willis, MD       Or  . ondansetron [facility](ZOFRAN) injection 4 mg  4 mg Intravenous Q6H PRN Oralia Manisavid Willis, MD   4 mg at 06/04/16 1450  . pantoprazole (PROTONIX) EC tablet 40 mg  40 mg Oral Daily Oralia Manisavid Willis, MD   40 mg at 06/05/16 1034  . phenytoin (DILANTIN) ER capsule 200 mg  200 mg Oral BID Oralia Manisavid Willis, MD   200 mg at 06/05/16 1034  . potassium chloride 30 mEq in sodium chloride 0.9 % 265 mL (KCL MULTIRUN) IVPB  30  mEq Intravenous Q4H Houston Siren, MD   30 mEq at 06/05/16 1035  . potassium chloride SA (K-DUR,KLOR-CON) CR tablet 20 mEq  20 mEq Oral BID Houston Siren, MD   20 mEq at 06/05/16 1034  . pravastatin (PRAVACHOL) tablet 20 mg  20 mg Oral Daily Oralia Manis, MD   20 mg at 06/05/16 1034     Discharge Medications: Please see discharge summary for a list of discharge medications.  Relevant Imaging Results:  Relevant Lab Results:   Additional Information  (SSN: [id number])  Hannah Gordon, Hannah Crocker, LCSW

## 2016-06-05 NOTE — Progress Notes (Signed)
[facility] [place] [facility]   PATIENT NAME: Hannah FactorKatie Gordon    MR#:  [id number]  DATE OF BIRTH:  1935-01-28  SUBJECTIVE:   Pt. Here due to AMS.  Remains confused.  No other acute events overnight.    REVIEW OF SYSTEMS:   Review of Systems  Unable to perform ROS: Mental acuity   Tolerating Diet: Yes Tolerating PT: Await Eval.   DRUG ALLERGIES:  No Known Allergies  VITALS:  Blood pressure (!) 127/57, pulse 81, temperature 99.2 F (37.3 C), temperature source Oral, resp. rate 18, height 5\' 4"  (1.626 m), weight 79.7 kg (175 lb 11.2 oz), SpO2 98 %.  PHYSICAL EXAMINATION:   Physical Exam  GENERAL:  82 y.o.-year-old patient lying in the bed in NAD.   EYES: Pupils equal, round, reactive to light and accommodation. No scleral icterus. Extraocular muscles intact.  HEENT: Head atraumatic, normocephalic. Oropharynx and nasopharynx clear.  NECK:  Supple, no jugular venous distention. No thyroid enlargement, no tenderness.  LUNGS: Normal breath sounds bilaterally, no wheezing, rales, rhonchi. No use of accessory muscles of respiration.  CARDIOVASCULAR: S1, S2 normal. No murmurs, rubs, or gallops.  ABDOMEN: Soft, nontender, nondistended. Bowel sounds present. No organomegaly or mass.  EXTREMITIES: No cyanosis, clubbing or edema b/l.    NEUROLOGIC: Cranial nerves II through XII are intact. No focal Motor or sensory deficits b/l. Globally weak. PSYCHIATRIC:  patient is alert and oriented x 1.  SKIN: No obvious rash, lesion, or ulcer.   LABORATORY PANEL:  CBC  Recent Labs Lab 06/04/16 0659  WBC 14.3*  HGB 10.2*  HCT 29.0*  PLT 211    Chemistries   Recent Labs Lab 06/03/16 1815  06/05/16 0444  NA 134*  < > 136  K 3.0*  < > 3.2*  CL 101  < > 110  CO2 25  < > 21*  GLUCOSE 133*  < > 113*  BUN 20  < > 14  CREATININE 1.31*  < > 1.03*  CALCIUM 8.8*  < > 7.9*  MG  --   < > 2.4  AST 23  --   --   ALT 23  --   --   ALKPHOS 42  --   --   BILITOT  0.6  --   --   < > = values in this interval not displayed. Cardiac Enzymes  Recent Labs Lab 06/03/16 1815  TROPONINI <0.03   RADIOLOGY:  Ct Abdomen Pelvis W Contrast  Result Date: 06/03/2016 CLINICAL DATA:  Altered mental status, vomiting, sepsis, generalized abdominal pain EXAM: CT ABDOMEN AND PELVIS WITH CONTRAST TECHNIQUE: Multidetector CT imaging of the abdomen and pelvis was performed using the standard protocol following bolus administration of intravenous contrast. CONTRAST:  75mL ISOVUE-300 IOPAMIDOL (ISOVUE-300) INJECTION 61% COMPARISON:  None. FINDINGS: Lower chest: Lung bases shows no acute findings. Small hiatal hernia. Hepatobiliary: There is a cyst in right hepatic lobe measures 7 mm. Cyst in left hepatic lobe laterally measures 1.6 cm. No calcified gallstones are noted within gallbladder. Pancreas: Unremarkable. No pancreatic ductal dilatation or surrounding inflammatory changes. Spleen: Normal in size without focal abnormality. Adrenals/Urinary Tract: There is a low density nodular mass left adrenal gland measures 4.2 cm. Although may represent adenoma further evaluation with MRI is recommended. Multiple bilateral parapelvic renal cysts are noted. There is a heterogeneous enhancing lesion lower pole posterior aspect of the right kidney measures 2.7 cm. Further evaluation with enhanced MRI is recommended to exclude renal cell carcinoma. Largest  central parapelvic cyst in midpole of the left kidney measures 2.5 cm. Delayed renal images shows bilateral renal symmetrical excretion. Bilateral visualized proximal ureter is unremarkable. Bilateral distal ureter is unremarkable. No thickening of urinary bladder wall. No urinary bladder filling defects. There is a cyst in lower pole anterior aspect of the left kidney measures 1.6 cm. Bilateral nonspecific perinephric stranding/fluid. Stomach/Bowel: There is no gastric outlet obstruction. No small bowel obstruction. No thickened or dilated small  bowel loops. No pericecal inflammation. The terminal ileum is unremarkable. Moderate stool noted within cecum and right colon. The appendix is not identified. There is moderate stool and some colonic gas within proximal transverse colon. Some colonic gas noted in mid transverse colon and distal transverse colon. There is redundant transverse colon. Scattered diverticula are noted descending colon. No definite evidence of acute colitis or diverticulitis. Multiple diverticula are noted within proximal sigmoid colon. No evidence of acute colitis or distal diverticulitis. No distal colonic obstruction. Vascular/Lymphatic: Atherosclerotic calcifications of abdominal aorta and iliac arteries are noted. No aortic aneurysm. No adenopathy. Reproductive: The uterus and ovaries are not identified probable surgically absent. Other: No ascites or free abdominal air. Musculoskeletal: Sagittal images of the spine shows diffuse osteopenia. Degenerative changes lower thoracic spine. No destructive bony lesions are noted. Facet degenerative changes are noted multilevel lumbar spine. IMPRESSION: 1. There is low-density nodular lesion left adrenal gland measures 4.1 cm. Although may represent adenoma further evaluation with MRI is warranted. 2. Bilateral renal parapelvic cysts are noted. There is solid appearing exophytic lesion in lower pole of the right kidney posteriorly measures 2.7 cm. Further correlation with enhanced MRI is recommended to exclude renal cell carcinoma. 3. No small bowel or colonic obstruction. No pericecal inflammation. The appendix is not identified. Moderate stool noted in right colon and proximal transverse colon. Colonic diverticula are noted descending colon and sigmoid colon. No definite evidence of acute colitis or diverticulitis. No distal colonic obstruction. 4. No ascites or free air. 5. Degenerative changes thoracolumbar spine. 6. Atherosclerotic calcifications of abdominal aorta and iliac arteries. 7.  Small hiatal hernia. Electronically Signed   By: Natasha Mead M.D.   On: 06/03/2016 20:40   Dg Chest Port 1 View  Result Date: 06/03/2016 CLINICAL DATA:  Altered mental status with vomiting EXAM: PORTABLE CHEST 1 VIEW COMPARISON:  11/12/2013 FINDINGS: Mildly low lung volumes with bibasilar atelectasis. Borderline to mild cardiomegaly. No edema. No pneumothorax. Degenerative changes of the bilateral shoulders. IMPRESSION: Low lung volume with mild bibasilar atelectasis. Borderline to mild cardiomegaly without edema. Electronically Signed   By: Jasmine Pang M.D.   On: 06/03/2016 18:39   ASSESSMENT AND PLAN:   Hannah Gordon  is a 82 y.o. female who presents with Acute encephalopathy. Patient is unable to contribute to her history of present illness due to her confusion.  *Acute encephalopathy - likely due to her UTI with also underlying dementia.  - cont. IV Ceftriaxone.  Remains confused but I think this is most likely her underlying dementia.  - discussed w/ son over the phone and her mental status has been declining over the past 6 months.   * UTI (urinary tract infection) - Continue IV Rocephin. Urine cultures positive for Escherichia coli but sensitivities pending.  *  AKI (acute kidney injury) (HCC) - due to urinary tract infection, dehydration. - improved with IV fluids and will d/c fluids for now.   * HTN (hypertension) - stable, holding BP meds for now as normotensive.   * Seizure disorder (HCC) -  Cont. Dilantin.    * GERD (gastroesophageal reflux disease) - cont. Protonix.   Await PT Eval.   Case discussed with Care Management/Social Worker. Management plans discussed with the patient, family and they are in agreement.  CODE STATUS: full  DVT Prophylaxis: lovenox  TOTAL TIME TAKING CARE OF THIS PATIENT: 30 minutes.   POSSIBLE D/C IN 1-2 DAYS, DEPENDING ON CLINICAL CONDITION.  Note: This dictation was prepared with Dragon dictation along with smaller phrase technology. Any  transcriptional errors that result from this process are unintentional.  Houston Siren M.D on 06/05/2016 at 1:49 PM  Between 7am to 6pm - Pager - [phone]  After 6pm go to [email or web address] - password [email or web address]  [facility]  Office  [phone]  CC: Primary care physician; Pcp Not In System

## 2016-06-05 NOTE — Clinical Social Work Placement (Signed)
   CLINICAL SOCIAL WORK PLACEMENT  NOTE  Date:  06/05/2016  Patient Details  Name: Hannah Gordon MRN: [id number] Date of Birth: 1934-11-24  Clinical Social Work is seeking post-discharge placement for this patient at the Skilled  Nursing Facility level of care (*CSW will initial, date and re-position this form in  chart as items are completed):  Yes   Patient/family provided with [facility] Clinical Social Work Department's list of facilities offering this level of care within the geographic area requested by the patient (or if unable, by the patient's family).  Yes   Patient/family informed of their freedom to choose among providers that offer the needed level of care, that participate in Medicare, Medicaid or managed care program needed by the patient, have an available bed and are willing to accept the patient.  Yes   Patient/family informed of 's ownership interest in [facility] and [facility], as well as of the fact that they are under no obligation to receive care at these facilities.  PASRR submitted to EDS on 06/05/16     PASRR number received on 06/05/16     Existing PASRR number confirmed on       FL2 transmitted to all facilities in geographic area requested by pt/family on 06/05/16     FL2 transmitted to all facilities within larger geographic area on       Patient informed that his/her managed care company has contracts with or will negotiate with certain facilities, including the following:            Patient/family informed of bed offers received.  Patient chooses bed at       Physician recommends and patient chooses bed at      Patient to be transferred to   on  .  Patient to be transferred to facility by       Patient family notified on   of transfer.  Name of family member notified:        PHYSICIAN       Additional Comment:    _______________________________________________ Daana Petrasek, Darleen CrockerBailey M, LCSW 06/05/2016, 4:14 PM

## 2016-06-05 NOTE — Care Management Important Message (Signed)
Important Message  Patient Details  Name: Bennie DallasKatie B Deveny MRN: [id number] Date of Birth: 05-Jul-1934   Medicare Important Message Given:  Yes    Gwenette GreetBrenda S Maryelizabeth Eberle, RN 06/05/2016, 8:34 AM

## 2016-06-05 NOTE — Clinical Social Work Note (Signed)
Clinical Social Work Assessment  Patient Details  Name: Hannah Gordon MRN: [id number] Date of Birth: [birth year]  Date of referral:  06/05/16               Reason for consult:  Facility Placement                Permission sought to share information with:  Chartered certified accountant granted to share information::  Yes, Verbal Permission Granted  Name::      Hannah::   Gordon   Relationship::     Contact Information:     Housing/Transportation Living arrangements for the past 2 months:  [place] of Information:  Patient, Adult Children Patient Interpreter Needed:  None Criminal Activity/Legal Involvement Pertinent to Current Situation/Hospitalization:  No - Comment as needed Significant Relationships:  Adult Children Lives with:  Adult Children Do you feel safe going back to the place where you live?  Yes Need for family participation in patient care:  Yes (Comment)  Care giving concerns:  Patient lives in [place] with her youngest son Hannah Gordon.    Social Worker assessment / plan:  Holiday representative (CSW) reviewed chart and noted that PT is recommending SNF. CSW met with patient and her 2 daughters were at bedside. Daughter Hannah Gordon [phone] is the main contact. CSW introduced self and explained role of CSW department. Daughters reported that patient lives in [place] with her son Hannah Gordon. Per daughters this dementia is new and patient has never been diagnosed with it. CSW provided emotional support. CSW explained that PT is recommending SNF and [name] will have to approve. This patient's humana plan is managed by [facility]. CSW faxed clinicals into [employer] to start authorization. [employer] case manager is Hannah Gordon [phone] ext. [phone]. Daughters are agreeable to SNF search in [place]. FL2 complete and faxed out. CSW will continue to follow and assist as needed.    Employment status:   Retired Nurse, adult PT Recommendations:  [place] / Referral to community resources:  [name]  Patient/Family's Response to care:  Patient's daughters are agreeable to SNF search in [place].   Patient/Family's Understanding of and Emotional Response to Diagnosis, Current Treatment, and Prognosis: Patient's daughters were very pleasant and thanked CSW for visit.   Emotional Assessment Appearance:  Appears stated age Attitude/Demeanor/Rapport:    Affect (typically observed):  Pleasant Orientation:  Oriented to Self, Oriented to Place, Fluctuating Orientation (Suspected and/or reported Sundowners) Alcohol / Substance use:  Not Applicable Psych involvement (Current and /or in the community):  No (Comment)  Discharge Needs  Concerns to be addressed:  Discharge Planning Concerns Readmission within the last 30 days:  No Current discharge risk:  Dependent with Mobility Barriers to Discharge:  Continued Medical Work up   [employer], Hannah Beets, LCSW 06/05/2016, 4:17 PM

## 2016-06-05 NOTE — Progress Notes (Signed)
MEDICATION RELATED CONSULT NOTE - INITIAL   Pharmacy Consult for potassium replacement Indication: hypokalemia  No Known Allergies  Patient Measurements: Height: 5\' 4"  (162.6 cm) Weight: 175 lb 11.2 oz (79.7 kg) IBW/kg (Calculated) : 54.7 Adjusted Body Weight:   Vital Signs: Temp: 99.2 F (37.3 C) (03/23 0456) Temp Source: Oral (03/23 0456) BP: 127/57 (03/23 0456) Pulse Rate: 81 (03/23 0456) Intake/Output from previous day: 03/22 0701 - 03/23 0700 In: 1735 [I.V.:1155; IV Piggyback:580] Out: -  Intake/Output from this shift: No intake/output data recorded.  Labs:  Recent Labs  06/03/16 1815 06/04/16 0659 06/04/16 1745 06/05/16 0444  WBC 10.9 14.3*  --   --   HGB 11.2* 10.2*  --   --   HCT 32.2* 29.0*  --   --   PLT 256 211  --   --   APTT 33  --   --   --   CREATININE 1.31* 0.95 1.21* 1.03*  MG  --  1.2*  --  2.4  ALBUMIN 3.6  --   --   --   PROT 7.1  --   --   --   AST 23  --   --   --   ALT 23  --   --   --   ALKPHOS 42  --   --   --   BILITOT 0.6  --   --   --    Estimated Creatinine Clearance: 45.2 mL/min (A) (by C-G formula based on SCr of 1.03 mg/dL (H)).   Microbiology: Recent Results (from the past 720 hour(s))  Blood Culture (routine x 2)     Status: None (Preliminary result)   Collection Time: 06/03/16  6:16 PM  Result Value Ref Range Status   Specimen Description BLOOD RIGHT ASSIST CONTROL  Final   Special Requests BOTTLES DRAWN AEROBIC AND ANAEROBIC BCAV  Final   Culture NO GROWTH < 24 HOURS  Final   Report Status PENDING  Incomplete  Blood Culture (routine x 2)     Status: None (Preliminary result)   Collection Time: 06/03/16  6:28 PM  Result Value Ref Range Status   Specimen Description BLOOD LEFT HAND  Final   Special Requests BOTTLES DRAWN AEROBIC AND ANAEROBIC BCAV  Final   Culture NO GROWTH < 24 HOURS  Final   Report Status PENDING  Incomplete    Medical History: Past Medical History:  Diagnosis Date  . Arthritis   . CHF  (congestive heart failure) (HCC)   . GERD (gastroesophageal reflux disease)   . Hyperlipemia   . Hypertension   . Seizures (HCC)     Medications:  Infusions:  . sodium chloride 75 mL/hr at 06/05/16 0400    Assessment: 81 yof cc AMS, ED workup shows UTI, started on broad spectrum ABX. PMH arthritis, CHF, GERD, HLD, HTN, seizures. Potassium in ED was 3, decreased to 2.2 this AM after gentle hydration. Pharmacy consulted to replace potassium.  Goal of Therapy:  K 3.5 to 5  Plan:  K level @ 2.8. Second bag of KCL 30MEQ/21665ml is currently running. Add on Mg resulted at 1.2. Mg 4g IV was given.  Pt refused to take oral K. Will give another 30MEQ IV x 2 and recheck K and Mg at 0500.   3/23 0444 K 3.2 - has increased after 120 meq IV potassium yesterday. Will give additional 30 mEq IV Q4H x 2 doses - intravenous due to patient refusal to take oral. Mg 2.4, Ca  7.9. Pharmacy will continue to follow and will switch to oral repletion as soon as possible.  Carola Frost, Pharm.D., BCPS Clinical Pharmacist 06/05/2016,7:11 AM

## 2016-06-05 NOTE — Evaluation (Signed)
Physical Therapy Evaluation Patient Details Name: Hannah Gordon MRN: [id number] DOB: 02/04/1935 Today's Date: 06/05/2016   History of Present Illness  Pt is a 82 y/o F who presented with acute encephalopathy. Pt found to have UTI. Pt's PMH includes seizures, back surgery, CHF.    Clinical Impression  Pt admitted with above diagnosis. Pt currently with functional limitations due to the deficits listed below (see PT Problem List). Ms. Daniely present with deficits with cognition, strength, and safe transfers and ambulation.  Pt unable to provide reliable home layout or PLOF information and no family present during PT Evaluation.  She currently requires close min guard assist for all aspects of mobility.  Given pt's current mobility status and impaired cognition, recommending SNF at d/c.   Pt will benefit from skilled PT to increase their independence and safety with mobility to allow discharge to the venue listed below.      Follow Up Recommendations SNF    Equipment Recommendations  Other (comment) (TBD at next venue of care)    Recommendations for Other Services       Precautions / Restrictions Precautions Precautions: Fall Restrictions Weight Bearing Restrictions: No      Mobility  Bed Mobility Overal bed mobility: Needs Assistance Bed Mobility: Supine to Sit     Supine to sit: Min guard;HOB elevated     General bed mobility comments: HOB elevated and pt uses bed rail to assist with pulling to sitting position.  She requires increased time and effort.  Transfers Overall transfer level: Needs assistance Equipment used: Rolling walker (2 wheeled) Transfers: Sit to/from Stand Sit to Stand: Min guard         General transfer comment: Cues for safe technique and hand placement.  Pt is slow to stand and demonstrates poor eccentric control with stand>sit.    Ambulation/Gait Ambulation/Gait assistance: Min guard Ambulation Distance (Feet): 100 Feet Assistive device:  Rolling walker (2 wheeled) Gait Pattern/deviations: Step-through pattern;Decreased stride length;Trunk flexed Gait velocity: decreased Gait velocity interpretation: Below normal speed for age/gender General Gait Details: Dec gait speed and pt requires cues for upright posture and forward gaze.  Close min guard due to mild instability noted while ambulating with RW.  Stairs            Wheelchair Mobility    Modified Rankin (Stroke Patients Only)       Balance Overall balance assessment: Needs assistance Sitting-balance support: No upper extremity supported;Feet supported Sitting balance-Leahy Scale: Good     Standing balance support: No upper extremity supported;During functional activity Standing balance-Leahy Scale: Fair Standing balance comment: Pt able to stand statically without UE support but RW for support with dynamic activities.                             Pertinent Vitals/Pain Pain Assessment: No/denies pain    Home Living Family/patient expects to be discharged to:: Unsure                 Additional Comments: When asking the pt who she lives with she replies with, "Mama".  Due to cognitive impairments pt unable to provide reliable information regarding her PLOF or home layout.  No family present to provide information.     Prior Function           Comments: Unknown at this point.     Hand Dominance   Dominant Hand:  (Pt replies "Left?")    Extremity/Trunk Assessment  Upper Extremity Assessment Upper Extremity Assessment: RUE deficits/detail;LUE deficits/detail RUE Deficits / Details: Unable to determine true strength of RUE as pt c/o pain at IV site and is reluctant to provide resistance during MMT.  Functionally pt appears to have at least 3/5 strength grossly. LUE Deficits / Details: Strength grossly 4/5    Lower Extremity Assessment Lower Extremity Assessment:  (Strength grossly 4-/5)    Cervical / Trunk  Assessment Cervical / Trunk Assessment: Kyphotic  Communication   Communication: No difficulties  Cognition Arousal/Alertness: Awake/alert Behavior During Therapy: WFL for tasks assessed/performed (very pleasant) Overall Cognitive Status: No family/caregiver present to determine baseline cognitive functioning                                 General Comments: Pt not oriented to DOB, place, situation, or time.  When ambulating in the hallway she looks around because she thinks her mother was calling her name.  She was very pleasantly confused.      General Comments      Exercises     Assessment/Plan    PT Assessment Patient needs continued PT services  PT Problem List Decreased strength;Decreased balance;Decreased cognition;Decreased knowledge of use of DME;Decreased safety awareness       PT Treatment Interventions DME instruction;Gait training;Stair training;Functional mobility training;Therapeutic activities;Therapeutic exercise;Balance training;Neuromuscular re-education;Cognitive remediation;Patient/family education    PT Goals (Current goals can be found in the Care Plan section)  Acute Rehab PT Goals Patient Stated Goal: none stated PT Goal Formulation: With patient Time For Goal Achievement: 06/19/16 Potential to Achieve Goals: Good    Frequency Min 2X/week   Barriers to discharge   Currently unclear on the amount of assist available as no family present during PT Evaluation.    Co-evaluation               End of Session Equipment Utilized During Treatment: Gait belt Activity Tolerance: Patient tolerated treatment well Patient left: in chair;with call bell/phone within reach;with chair alarm set Nurse Communication: Mobility status PT Visit Diagnosis: Unsteadiness on feet (R26.81);Muscle weakness (generalized) (M62.81)    Time: 9811-91471300-1324 PT Time Calculation (min) (ACUTE ONLY): 24 min   Charges:   PT Evaluation $PT Eval Low Complexity: 1  Procedure PT Treatments $Gait Training: 8-22 mins   PT G Codes:          Encarnacion ChuAshley Abashian PT, DPT 06/05/2016, 2:42 PM

## 2016-06-06 LAB — BASIC METABOLIC PANEL
ANION GAP: 4 — AB (ref 5–15)
BUN: 11 mg/dL (ref 6–20)
CALCIUM: 8.2 mg/dL — AB (ref 8.9–10.3)
CO2: 22 mmol/L (ref 22–32)
Chloride: 112 mmol/L — ABNORMAL HIGH (ref 101–111)
Creatinine, Ser: 0.86 mg/dL (ref 0.44–1.00)
Glucose, Bld: 100 mg/dL — ABNORMAL HIGH (ref 65–99)
Potassium: 4.1 mmol/L (ref 3.5–5.1)
Sodium: 138 mmol/L (ref 135–145)

## 2016-06-06 LAB — PHOSPHORUS: PHOSPHORUS: 1.9 mg/dL — AB (ref 2.5–4.6)

## 2016-06-06 LAB — MAGNESIUM: MAGNESIUM: 2 mg/dL (ref 1.7–2.4)

## 2016-06-06 LAB — URINE CULTURE

## 2016-06-06 MED ORDER — K PHOS MONO-SOD PHOS DI & MONO 155-852-130 MG PO TABS
500.0000 mg | ORAL_TABLET | Freq: Three times a day (TID) | ORAL | Status: AC
  Administered 2016-06-06 (×4): 500 mg via ORAL
  Filled 2016-06-06 (×4): qty 2

## 2016-06-06 MED ORDER — LOSARTAN POTASSIUM 50 MG PO TABS
100.0000 mg | ORAL_TABLET | Freq: Every day | ORAL | Status: DC
  Administered 2016-06-06 – 2016-06-08 (×3): 100 mg via ORAL
  Filled 2016-06-06 (×3): qty 2

## 2016-06-06 MED ORDER — LOSARTAN POTASSIUM-HCTZ 100-25 MG PO TABS: 1.0000 | ORAL_TABLET | Freq: Every day | ORAL | Status: DC

## 2016-06-06 MED ORDER — HYDROCHLOROTHIAZIDE 25 MG PO TABS
25.0000 mg | ORAL_TABLET | Freq: Every day | ORAL | Status: DC
  Administered 2016-06-06 – 2016-06-08 (×3): 25 mg via ORAL
  Filled 2016-06-06 (×3): qty 1

## 2016-06-06 NOTE — Clinical Social Work Note (Signed)
CSW met with patient and her family at bedside to discuss bed offers. The family was in agreement with the patient going to [facility] when stable. CSW updated in [place]. CSW will con't to follow for dc planning.  Santiago Bumpers, MSW, Latanya Presser [phone]

## 2016-06-06 NOTE — Progress Notes (Signed)
[facility] [place] [facility]   PATIENT NAME: Hannah Gordon    MR#:  [id number]  DATE OF BIRTH:  06-Sep-1934  SUBJECTIVE:   Pt. Here due to AMS.  Remains confused.  Daughter and son-in-law at bedside.    REVIEW OF SYSTEMS:   Review of Systems  Unable to perform ROS: Mental acuity   Tolerating Diet: Yes Tolerating PT:  Eval noted.   DRUG ALLERGIES:  No Known Allergies  VITALS:  Blood pressure (!) 146/63, pulse 77, temperature 98.1 F (36.7 C), temperature source Oral, resp. rate 18, height 5\' 4"  (1.626 m), weight 79.7 kg (175 lb 11.2 oz), SpO2 98 %.  PHYSICAL EXAMINATION:   Physical Exam  GENERAL:  82 y.o.-year-old patient sitting up in chair in NAD.   EYES: Pupils equal, round, reactive to light and accommodation. No scleral icterus. Extraocular muscles intact.  HEENT: Head atraumatic, normocephalic. Oropharynx and nasopharynx clear.  NECK:  Supple, no jugular venous distention. No thyroid enlargement, no tenderness.  LUNGS: Normal breath sounds bilaterally, no wheezing, rales, rhonchi. No use of accessory muscles of respiration.  CARDIOVASCULAR: S1, S2 normal. No murmurs, rubs, or gallops.  ABDOMEN: Soft, nontender, nondistended. Bowel sounds present. No organomegaly or mass.  EXTREMITIES: No cyanosis, clubbing or edema b/l.    NEUROLOGIC: Cranial nerves II through XII are intact. No focal Motor or sensory deficits b/l. Globally weak. PSYCHIATRIC:  patient is alert and oriented x 1.  SKIN: No obvious rash, lesion, or ulcer.   LABORATORY PANEL:  CBC  Recent Labs Lab 06/04/16 0659  WBC 14.3*  HGB 10.2*  HCT 29.0*  PLT 211    Chemistries   Recent Labs Lab 06/03/16 1815  06/06/16 0521  NA 134*  < > 138  K 3.0*  < > 4.1  CL 101  < > 112*  CO2 25  < > 22  GLUCOSE 133*  < > 100*  BUN 20  < > 11  CREATININE 1.31*  < > 0.86  CALCIUM 8.8*  < > 8.2*  MG  --   < > 2.0  AST 23  --   --   ALT 23  --   --   ALKPHOS 42  --   --    BILITOT 0.6  --   --   < > = values in this interval not displayed. Cardiac Enzymes  Recent Labs Lab 06/03/16 1815  TROPONINI <0.03   RADIOLOGY:  No results found. ASSESSMENT AND PLAN:   Hannah FactorKatie Gordon  is a 82 y.o. female who presents with Acute encephalopathy. Patient is unable to contribute to her history of present illness due to her confusion.  *Acute encephalopathy - likely due to her UTI with also underlying dementia.  - cont. IV Ceftriaxone.  Remains confused but I think this is most likely her underlying dementia.  - improved but remains confused mostly due to her dementia.   * UTI (urinary tract infection) - Continue IV Rocephin. Urine cultures positive for Escherichia coli and it's pansensitive. - treat for total of 5 days.   *  AKI (acute kidney injury) (HCC) - due to urinary tract infection, dehydration. - resolved w/ IV fluids.   * HTN (hypertension) - will resume Losartan/hCTZ.    * Seizure disorder (HCC) -  Cont. Dilantin.    * GERD (gastroesophageal reflux disease) - cont. Protonix.   PT eval noted and pt. Will need STR and Social work aware. Discussed plan of care w/ family  at bedside.   Case discussed with Care Management/Social Worker. Management plans discussed with the patient, family and they are in agreement.  CODE STATUS: full  DVT Prophylaxis: lovenox  TOTAL TIME TAKING CARE OF THIS PATIENT: 30 minutes.   POSSIBLE D/C IN 1-2 DAYS, DEPENDING ON CLINICAL CONDITION.  Note: This dictation was prepared with Dragon dictation along with smaller phrase technology. Any transcriptional errors that result from this process are unintentional.  Houston Siren M.D on 06/06/2016 at 1:09 PM  Between 7am to 6pm - Pager - [phone]  After 6pm go to [email or web address] - password [employer]  [facility]  Office  [phone]  CC: Primary care physician; Pcp Not In System

## 2016-06-06 NOTE — Progress Notes (Signed)
MEDICATION RELATED CONSULT NOTE - Follow up  Pharmacy Consult for electrolyte replacement Indication: hypokalemia  No Known Allergies  Patient Measurements: Height: 5\' 4"  (162.6 cm) Weight: 175 lb 11.2 oz (79.7 kg) IBW/kg (Calculated) : 54.7 Adjusted Body Weight:   Vital Signs: Temp: 98.5 F (36.9 C) (03/24 0321) Temp Source: Oral (03/24 0321) BP: 138/61 (03/24 0321) Pulse Rate: 72 (03/24 0321) Intake/Output from previous day: 03/23 0701 - 03/24 0700 In: 1350 [P.O.:120; I.V.:650; IV Piggyback:580] Out: 300 [Urine:300] Intake/Output from this shift: No intake/output data recorded.  Labs:  Recent Labs  06/03/16 1815 06/04/16 0659  06/05/16 0444 06/05/16 1759 06/06/16 0521  WBC 10.9 14.3*  --   --   --   --   HGB 11.2* 10.2*  --   --   --   --   HCT 32.2* 29.0*  --   --   --   --   PLT 256 211  --   --   --   --   APTT 33  --   --   --   --   --   CREATININE 1.31* 0.95  < > 1.03* 0.96 0.86  MG  --  1.2*  --  2.4  --  2.0  PHOS  --   --   --   --   --  1.9*  ALBUMIN 3.6  --   --   --   --   --   PROT 7.1  --   --   --   --   --   AST 23  --   --   --   --   --   ALT 23  --   --   --   --   --   ALKPHOS 42  --   --   --   --   --   BILITOT 0.6  --   --   --   --   --   < > = values in this interval not displayed. Estimated Creatinine Clearance: 54.2 mL/min (by C-G formula based on SCr of 0.86 mg/dL).   Microbiology: Recent Results (from the past 720 hour(s))  Blood Culture (routine x 2)     Status: None (Preliminary result)   Collection Time: 06/03/16  6:16 PM  Result Value Ref Range Status   Specimen Description BLOOD RIGHT ASSIST CONTROL  Final   Special Requests BOTTLES DRAWN AEROBIC AND ANAEROBIC BCAV  Final   Culture NO GROWTH 3 DAYS  Final   Report Status PENDING  Incomplete  Blood Culture (routine x 2)     Status: None (Preliminary result)   Collection Time: 06/03/16  6:28 PM  Result Value Ref Range Status   Specimen Description BLOOD LEFT HAND   Final   Special Requests BOTTLES DRAWN AEROBIC AND ANAEROBIC BCAV  Final   Culture NO GROWTH 3 DAYS  Final   Report Status PENDING  Incomplete  Urine culture     Status: Abnormal   Collection Time: 06/03/16  6:39 PM  Result Value Ref Range Status   Specimen Description URINE, RANDOM  Final   Special Requests NONE  Final   Culture >=100,000 COLONIES/mL ESCHERICHIA COLI (A)  Final   Report Status 06/06/2016 FINAL  Final   Organism ID, Bacteria ESCHERICHIA COLI (A)  Final      Susceptibility   Escherichia coli - MIC*    AMPICILLIN <=2 SENSITIVE Sensitive     CEFAZOLIN <=4 SENSITIVE Sensitive  CEFTRIAXONE <=1 SENSITIVE Sensitive     CIPROFLOXACIN <=0.25 SENSITIVE Sensitive     GENTAMICIN <=1 SENSITIVE Sensitive     IMIPENEM <=0.25 SENSITIVE Sensitive     NITROFURANTOIN <=16 SENSITIVE Sensitive     TRIMETH/SULFA <=20 SENSITIVE Sensitive     AMPICILLIN/SULBACTAM <=2 SENSITIVE Sensitive     PIP/TAZO <=4 SENSITIVE Sensitive     Extended ESBL NEGATIVE Sensitive     * >=100,000 COLONIES/mL ESCHERICHIA COLI    Medical History: Past Medical History:  Diagnosis Date  . Arthritis   . CHF (congestive heart failure) (HCC)   . GERD (gastroesophageal reflux disease)   . Hyperlipemia   . Hypertension   . Seizures (HCC)     Assessment: 81 yof cc AMS, ED workup shows UTI, started on broad spectrum ABX. PMH arthritis, CHF, GERD, HLD, HTN, seizures.   K = 4.1, Mag = 2, Phos = 1.9.   Goal of Therapy:  K 3.5 to 5  Plan:  Supplemented with oral K Phos.  Will recheck all electrolytes in the AM  Stormy CardKatsoudas,Elier Zellars K, RPh Clinical Pharmacist 06/06/2016,8:45 AM

## 2016-06-06 NOTE — Clinical Social Work Note (Signed)
CSW left message for daughter to return call to discuss bed offers. CSW waiting for return call.  Argentina PonderKaren Martha Donyel Castagnola, MSW, Theresia MajorsLCSWA [phone]

## 2016-06-07 LAB — BASIC METABOLIC PANEL
ANION GAP: 4 — AB (ref 5–15)
BUN: 6 mg/dL (ref 6–20)
CALCIUM: 8.3 mg/dL — AB (ref 8.9–10.3)
CO2: 24 mmol/L (ref 22–32)
Chloride: 106 mmol/L (ref 101–111)
Creatinine, Ser: 0.77 mg/dL (ref 0.44–1.00)
GLUCOSE: 100 mg/dL — AB (ref 65–99)
POTASSIUM: 3.7 mmol/L (ref 3.5–5.1)
SODIUM: 134 mmol/L — AB (ref 135–145)

## 2016-06-07 LAB — PHOSPHORUS: Phosphorus: 3.5 mg/dL (ref 2.5–4.6)

## 2016-06-07 LAB — MAGNESIUM: MAGNESIUM: 1.7 mg/dL (ref 1.7–2.4)

## 2016-06-07 NOTE — Progress Notes (Signed)
MEDICATION RELATED CONSULT NOTE - Follow up  Pharmacy Consult for electrolyte replacement Indication: hypokalemia  No Known Allergies  Patient Measurements: Height: 5\' 4"  (162.6 cm) Weight: 175 lb 11.2 oz (79.7 kg) IBW/kg (Calculated) : 54.7 Adjusted Body Weight:   Vital Signs: Temp: 98.2 F (36.8 C) (03/25 0422) Temp Source: Oral (03/25 0422) BP: 134/62 (03/25 0422) Pulse Rate: 73 (03/25 0422) Intake/Output from previous day: 03/24 0701 - 03/25 0700 In: 480 [P.O.:480] Out: 900 [Urine:900] Intake/Output from this shift: Total I/O In: 120 [P.O.:120] Out: 200 [Urine:200]  Labs:  Recent Labs  06/04/16 0659  06/05/16 0444 06/05/16 1759 06/06/16 0521 06/07/16 0511  WBC 14.3*  --   --   --   --   --   HGB 10.2*  --   --   --   --   --   HCT 29.0*  --   --   --   --   --   PLT 211  --   --   --   --   --   CREATININE 0.95  < > 1.03* 0.96 0.86 0.77  MG 1.2*  --  2.4  --  2.0 1.7  PHOS  --   --   --   --  1.9* 3.5  < > = values in this interval not displayed. Estimated Creatinine Clearance: 58.2 mL/min (by C-G formula based on SCr of 0.77 mg/dL).   Microbiology: Recent Results (from the past 720 hour(s))  Blood Culture (routine x 2)     Status: None (Preliminary result)   Collection Time: 06/03/16  6:16 PM  Result Value Ref Range Status   Specimen Description BLOOD RIGHT ASSIST CONTROL  Final   Special Requests BOTTLES DRAWN AEROBIC AND ANAEROBIC BCAV  Final   Culture NO GROWTH 3 DAYS  Final   Report Status PENDING  Incomplete  Blood Culture (routine x 2)     Status: None (Preliminary result)   Collection Time: 06/03/16  6:28 PM  Result Value Ref Range Status   Specimen Description BLOOD LEFT HAND  Final   Special Requests BOTTLES DRAWN AEROBIC AND ANAEROBIC BCAV  Final   Culture NO GROWTH 3 DAYS  Final   Report Status PENDING  Incomplete  Urine culture     Status: Abnormal   Collection Time: 06/03/16  6:39 PM  Result Value Ref Range Status   Specimen  Description URINE, RANDOM  Final   Special Requests NONE  Final   Culture >=100,000 COLONIES/mL ESCHERICHIA COLI (A)  Final   Report Status 06/06/2016 FINAL  Final   Organism ID, Bacteria ESCHERICHIA COLI (A)  Final      Susceptibility   Escherichia coli - MIC*    AMPICILLIN <=2 SENSITIVE Sensitive     CEFAZOLIN <=4 SENSITIVE Sensitive     CEFTRIAXONE <=1 SENSITIVE Sensitive     CIPROFLOXACIN <=0.25 SENSITIVE Sensitive     GENTAMICIN <=1 SENSITIVE Sensitive     IMIPENEM <=0.25 SENSITIVE Sensitive     NITROFURANTOIN <=16 SENSITIVE Sensitive     TRIMETH/SULFA <=20 SENSITIVE Sensitive     AMPICILLIN/SULBACTAM <=2 SENSITIVE Sensitive     PIP/TAZO <=4 SENSITIVE Sensitive     Extended ESBL NEGATIVE Sensitive     * >=100,000 COLONIES/mL ESCHERICHIA COLI    Medical History: Past Medical History:  Diagnosis Date  . Arthritis   . CHF (congestive heart failure) (HCC)   . GERD (gastroesophageal reflux disease)   . Hyperlipemia   . Hypertension   .  Seizures (HCC)     Assessment: 81 yof cc AMS, ED workup shows UTI, started on broad spectrum ABX. PMH arthritis, CHF, GERD, HLD, HTN, seizures.   K = 4.1, Mag = 2, Phos = 1.9.   Goal of Therapy:  K 3.5 to 5  Plan:  Supplemented with oral K Phos.  Will recheck all electrolytes in the AM  3/25 AM electrolytes WNL. No supplementation ordered. Recheck BMP and Mg tomorrow AM.   Erich Montane, RPh Clinical Pharmacist 06/07/2016,6:02 AM

## 2016-06-07 NOTE — Progress Notes (Signed)
[facility] -  at [facility]   PATIENT NAME: Hannah Gordon    MR#:  [id number]  DATE OF BIRTH:  [birth year]  SUBJECTIVE:   No acute events overnight.  Afebrile.    REVIEW OF SYSTEMS:   Review of Systems  Unable to perform ROS: Mental acuity   Tolerating Diet: Yes Tolerating PT:  Eval noted.   DRUG ALLERGIES:  No Known Allergies  VITALS:  Blood pressure 126/67, pulse 65, temperature 98.2 F (36.8 C), temperature source Oral, resp. rate 20, height 5\' 4"  (1.626 m), weight 79.7 kg (175 lb 11.2 oz), SpO2 98 %.  PHYSICAL EXAMINATION:   Physical Exam  GENERAL:  82 y.o.-year-old patient sitting up in chair in NAD.   EYES: Pupils equal, round, reactive to light and accommodation. No scleral icterus. Extraocular muscles intact.  HEENT: Head atraumatic, normocephalic. Oropharynx and nasopharynx clear.  NECK:  Supple, no jugular venous distention. No thyroid enlargement, no tenderness.  LUNGS: Normal breath sounds bilaterally, no wheezing, rales, rhonchi. No use of accessory muscles of respiration.  CARDIOVASCULAR: S1, S2 normal. No murmurs, rubs, or gallops.  ABDOMEN: Soft, nontender, nondistended. Bowel sounds present. No organomegaly or mass.  EXTREMITIES: No cyanosis, clubbing or edema b/l.    NEUROLOGIC: Cranial nerves II through XII are intact. No focal Motor or sensory deficits b/l. Globally weak. PSYCHIATRIC:  patient is alert and oriented x 1.  SKIN: No obvious rash, lesion, or ulcer.   LABORATORY PANEL:  CBC  Recent Labs Lab 06/04/16 0659  WBC 14.3*  HGB 10.2*  HCT 29.0*  PLT 211    Chemistries   Recent Labs Lab 06/03/16 1815  06/07/16 0511  NA 134*  < > 134*  K 3.0*  < > 3.7  CL 101  < > 106  CO2 25  < > 24  GLUCOSE 133*  < > 100*  BUN 20  < > 6  CREATININE 1.31*  < > 0.77  CALCIUM 8.8*  < > 8.3*  MG  --   < > 1.7  AST 23  --   --   ALT 23  --   --   ALKPHOS 42  --   --   BILITOT 0.6  --   --   < > = values in this  interval not displayed. Cardiac Enzymes  Recent Labs Lab 06/03/16 1815  TROPONINI <0.03   RADIOLOGY:  No results found. ASSESSMENT AND PLAN:   Hannah Gordon  is a 82 y.o. female who presents with Acute encephalopathy. Patient is unable to contribute to her history of present illness due to her confusion.  *Acute encephalopathy - likely due to her UTI with also underlying dementia.  - cont. IV Ceftriaxone.  Remains confused but this is most likely her underlying dementia.   * UTI (urinary tract infection) - Continue IV Rocephin. Urine cultures positive for Escherichia coli and it's pansensitive. - treat for total of 5 days.   *  AKI (acute kidney injury) (HCC) - due to urinary tract infection, dehydration. - resolved w/ IV fluids.   * HTN (hypertension) - will resume Losartan/hCTZ.    * Seizure disorder (HCC) -  Cont. Dilantin.    * GERD (gastroesophageal reflux disease) - cont. Protonix.   PT eval noted and pt. Will need STR and Social work aware and will d/c there tomorrow if possible.   Case discussed with Care Management/Social Worker. Management plans discussed with the patient, family and they are in agreement.  CODE STATUS: full  DVT Prophylaxis: lovenox  TOTAL TIME TAKING CARE OF THIS PATIENT: 25 minutes.   POSSIBLE D/C IN 1-2 DAYS, DEPENDING ON CLINICAL CONDITION.  Note: This dictation was prepared with Dragon dictation along with smaller phrase technology. Any transcriptional errors that result from this process are unintentional.  Houston SirenSAINANI,Abbiegail Landgren J M.D on 06/07/2016 at 1:20 PM  Between 7am to 6pm - Pager - [phone]  After 6pm go to [email or web address] - password [employer] [email or web address]  [facility] [place] [facility]  Office  [phone]  CC: Primary care physician; Pcp Not In System

## 2016-06-08 LAB — BASIC METABOLIC PANEL
Anion gap: 4 — ABNORMAL LOW (ref 5–15)
BUN: <5 mg/dL — ABNORMAL LOW (ref 6–20)
CALCIUM: 8.3 mg/dL — AB (ref 8.9–10.3)
CO2: 27 mmol/L (ref 22–32)
CREATININE: 1 mg/dL (ref 0.44–1.00)
Chloride: 105 mmol/L (ref 101–111)
GFR calc non Af Amer: 52 mL/min — ABNORMAL LOW (ref >60)
Glucose, Bld: 95 mg/dL (ref 65–99)
Potassium: 3.6 mmol/L (ref 3.5–5.1)
SODIUM: 136 mmol/L (ref 135–145)

## 2016-06-08 LAB — MAGNESIUM: MAGNESIUM: 1.6 mg/dL — AB (ref 1.7–2.4)

## 2016-06-08 LAB — CULTURE, BLOOD (ROUTINE X 2)
CULTURE: NO GROWTH
CULTURE: NO GROWTH

## 2016-06-08 MED ORDER — CEFUROXIME AXETIL 250 MG PO TABS: 250.0000 mg | ORAL_TABLET | Freq: Two times a day (BID) | ORAL | 0 refills | Status: DC

## 2016-06-08 MED ORDER — CEFUROXIME AXETIL 250 MG PO TABS: 250.0000 mg | ORAL_TABLET | Freq: Two times a day (BID) | ORAL | 0 refills | Status: AC

## 2016-06-08 NOTE — Progress Notes (Signed)
MEDICATION RELATED CONSULT NOTE - Follow up  Pharmacy Consult for electrolyte replacement Indication: hypokalemia  No Known Allergies  Patient Measurements: Height: 5\' 4"  (162.6 cm) Weight: 175 lb 11.2 oz (79.7 kg) IBW/kg (Calculated) : 54.7 Adjusted Body Weight:   Vital Signs: Temp: 98.2 F (36.8 C) (03/26 0434) Temp Source: Oral (03/26 0434) BP: 137/67 (03/26 0434) Pulse Rate: 73 (03/26 0434) Intake/Output from previous day: 03/25 0701 - 03/26 0700 In: 240 [P.O.:240] Out: 200 [Urine:200] Intake/Output from this shift: Total I/O In: 120 [P.O.:120] Out: -   Labs:  Recent Labs  06/06/16 0521 06/07/16 0511 06/08/16 0413  CREATININE 0.86 0.77 1.00  MG 2.0 1.7 1.6*  PHOS 1.9* 3.5  --    Estimated Creatinine Clearance: 46.6 mL/min (by C-G formula based on SCr of 1 mg/dL).   Microbiology: Recent Results (from the past 720 hour(s))  Blood Culture (routine x 2)     Status: None   Collection Time: 06/03/16  6:16 PM  Result Value Ref Range Status   Specimen Description BLOOD RIGHT ASSIST CONTROL  Final   Special Requests BOTTLES DRAWN AEROBIC AND ANAEROBIC BCAV  Final   Culture NO GROWTH 5 DAYS  Final   Report Status 06/08/2016 FINAL  Final  Blood Culture (routine x 2)     Status: None   Collection Time: 06/03/16  6:28 PM  Result Value Ref Range Status   Specimen Description BLOOD LEFT HAND  Final   Special Requests BOTTLES DRAWN AEROBIC AND ANAEROBIC BCAV  Final   Culture NO GROWTH 5 DAYS  Final   Report Status 06/08/2016 FINAL  Final  Urine culture     Status: Abnormal   Collection Time: 06/03/16  6:39 PM  Result Value Ref Range Status   Specimen Description URINE, RANDOM  Final   Special Requests NONE  Final   Culture >=100,000 COLONIES/mL ESCHERICHIA COLI (A)  Final   Report Status 06/06/2016 FINAL  Final   Organism ID, Bacteria ESCHERICHIA COLI (A)  Final      Susceptibility   Escherichia coli - MIC*    AMPICILLIN <=2 SENSITIVE Sensitive     CEFAZOLIN  <=4 SENSITIVE Sensitive     CEFTRIAXONE <=1 SENSITIVE Sensitive     CIPROFLOXACIN <=0.25 SENSITIVE Sensitive     GENTAMICIN <=1 SENSITIVE Sensitive     IMIPENEM <=0.25 SENSITIVE Sensitive     NITROFURANTOIN <=16 SENSITIVE Sensitive     TRIMETH/SULFA <=20 SENSITIVE Sensitive     AMPICILLIN/SULBACTAM <=2 SENSITIVE Sensitive     PIP/TAZO <=4 SENSITIVE Sensitive     Extended ESBL NEGATIVE Sensitive     * >=100,000 COLONIES/mL ESCHERICHIA COLI    Medical History: Past Medical History:  Diagnosis Date  . Arthritis   . CHF (congestive heart failure) (HCC)   . GERD (gastroesophageal reflux disease)   . Hyperlipemia   . Hypertension   . Seizures (HCC)     Assessment: 81 yof cc AMS, ED workup shows UTI, started on broad spectrum ABX. PMH arthritis, CHF, GERD, HLD, HTN, seizures.   K = 4.1, Mag = 2, Phos = 1.9.   Goal of Therapy:  K 3.5 to 5  Plan:  Supplemented with oral K Phos.  Will recheck all electrolytes in the AM  3/25 AM electrolytes WNL. No supplementation ordered. Recheck BMP and Mg tomorrow AM.  3/26 AM Electrolytes WNL except Mg 1.6. Discharge imminent - will replete if patient stays. Otherwise no supplement at this time.   Carola FrostNathan A Corwin Kuiken, RPh Clinical Pharmacist  06/08/2016,11:18 AM

## 2016-06-08 NOTE — Clinical Social Work Note (Signed)
Pt is ready for discharge today. Pt was set up to go to [facility], however pt's family has chosen to take pt home with home health services. CSW updated RNCM and RN. CSW notified [facility]' of change in discharge plan, as well as MNS Writer(insurance management). Pt's son will provide transportation this evening. CSW is signing off as no further needs identified.   Dede QuerySarah Shakiara Lukic, MSW, LCSW  Clinical Social Worker [phone]

## 2016-06-08 NOTE — Progress Notes (Signed)
qPhysical Therapy Treatment Patient Details Name: Hannah Gordon MRN: [id number] DOB: 05/09/1934 Today's Date: 06/08/2016    History of Present Illness Pt is a 82 y/o F who presented with acute encephalopathy. Pt found to have UTI. Pt's PMH includes seizures, back surgery, CHF.   PT Comments     Hannah Gordon is making modest progress with mobility but continues to require min guard assist for bed mobility, transfers, and short distance ambulation.  She reports fatigue after ambulating 100 ft with RW.  SNF remains most appropriate d/c plan at this time.    Follow Up Recommendations  SNF     Equipment Recommendations  Other (comment) (TBD at next venue of care)    Recommendations for Other Services       Precautions / Restrictions Precautions Precautions: Fall Restrictions Weight Bearing Restrictions: No    Mobility  Bed Mobility Overal bed mobility: Needs Assistance Bed Mobility: Supine to Sit;Sit to Supine     Supine to sit: Min guard;HOB elevated Sit to supine: Min guard   General bed mobility comments: Pt requires increased time and effort for supine>sit and to scoot to EOB once supine at end of session.  Transfers Overall transfer level: Needs assistance Equipment used: Rolling walker (2 wheeled) Transfers: Sit to/from Stand Sit to Stand: Min guard         General transfer comment: Cues for safe technique and hand placement.  Pt is slow to stand.  With stand>sit pt does not reach back for bed initially.  Repeated stand>sit with cues to reach back for safe transfer.  Ambulation/Gait Ambulation/Gait assistance: Min guard Ambulation Distance (Feet): 100 Feet Assistive device: Rolling walker (2 wheeled) Gait Pattern/deviations: Step-through pattern;Decreased stride length;Trunk flexed Gait velocity: decreased Gait velocity interpretation: Below normal speed for age/gender General Gait Details: Dec gait speed and pt requires cues for upright posture and forward  gaze.  Close min guard due to mild instability noted while ambulating with RW.  Pt reports fatigue at end of ambulation.   Stairs            Wheelchair Mobility    Modified Rankin (Stroke Patients Only)       Balance Overall balance assessment: Needs assistance Sitting-balance support: No upper extremity supported;Feet supported Sitting balance-Leahy Scale: Good     Standing balance support: No upper extremity supported;During functional activity Standing balance-Leahy Scale: Fair Standing balance comment: Pt able to stand statically without UE support but RW for support with dynamic activities.                            Cognition Arousal/Alertness: Awake/alert Behavior During Therapy: WFL for tasks assessed/performed (very pleasant) Overall Cognitive Status: No family/caregiver present to determine baseline cognitive functioning                                        Exercises General Exercises - Upper Extremity Shoulder Flexion: AROM;Both;10 reps;Seated General Exercises - Lower Extremity Ankle Circles/Pumps: AROM;Both;10 reps;Seated Long Arc Quad: AROM;Both;10 reps;Seated Hip Flexion/Marching: Both;10 reps;Seated    General Comments        Pertinent Vitals/Pain Pain Assessment: No/denies pain    Home Living                      Prior Function  PT Goals (current goals can now be found in the care plan section) Acute Rehab PT Goals Patient Stated Goal: none stated PT Goal Formulation: With patient Time For Goal Achievement: 06/19/16 Potential to Achieve Goals: Good Progress towards PT goals: Progressing toward goals    Frequency    Min 2X/week      PT Plan Current plan remains appropriate    Co-evaluation             End of Session Equipment Utilized During Treatment: Gait belt Activity Tolerance: Patient tolerated treatment well;Patient limited by fatigue Patient left: with call  bell/phone within reach;in bed;with bed alarm set Nurse Communication: Mobility status PT Visit Diagnosis: Unsteadiness on feet (R26.81);Muscle weakness (generalized) (M62.81)     Time: [id number] PT Time Calculation (min) (ACUTE ONLY): 13 min  Charges:  $Gait Training: 8-22 mins                    G Codes:       Encarnacion ChuAshley Shahram Alexopoulos PT, DPT 06/08/2016, 11:44 AM

## 2016-06-08 NOTE — Discharge Summary (Signed)
[employer] [facility] [place] [facility]   PATIENT NAME: Hannah Gordon    MR#:  [id number]  DATE OF BIRTH:  [birth year]  DATE OF ADMISSION:  06/03/2016 ADMITTING PHYSICIAN: Oralia Manis, MD  DATE OF DISCHARGE: 06/08/2016  PRIMARY CARE PHYSICIAN: Pcp Not In System    ADMISSION DIAGNOSIS:  Lower urinary tract infectious disease [N39.0] Encephalopathy [G93.40]  DISCHARGE DIAGNOSIS:  Principal Problem:   Acute encephalopathy Active Problems:   UTI (urinary tract infection)   HTN (hypertension)   GERD (gastroesophageal reflux disease)   Seizure disorder (HCC)   AKI (acute kidney injury) (HCC)   SECONDARY DIAGNOSIS:   Past Medical History:  Diagnosis Date  . Arthritis   . CHF (congestive heart failure) (HCC)   . GERD (gastroesophageal reflux disease)   . Hyperlipemia   . Hypertension   . Seizures [facility])     HOSPITAL COURSE:   KatieTayloris a 82 y.o.femalewho presents with Acute encephalopathy. Patient is unable to contribute to her history of present illness due to her confusion.  *Acute encephalopathy - likely due to her UTI with also underlying dementia.  - pt. Was treated with IV Ceftriaxone for the UTI  And her mental status has improved.  She still remains confused which is due to her dementia.    *UTI (urinary tract infection) - pt. Was treated with IV Rocephin while in hospital and now being discharged on Ceftin for 2 more days.   *AKI (acute kidney injury) (HCC) - due to urinary tract infection, dehydration. - resolved w/ IV fluids.   *HTN (hypertension) - pt. Will cont. Her Losartan/HCTZ  *Seizure disorder (HCC) -  she will Cont. Dilantin.   *GERD (gastroesophageal reflux disease) - pt. Will cont. Protonix.   DISCHARGE CONDITIONS:   Stable.   CONSULTS OBTAINED:    DRUG ALLERGIES:  No Known Allergies  DISCHARGE MEDICATIONS:   Allergies as of 06/08/2016   No Known Allergies     Medication List    STOP taking these  medications   meloxicam 7.5 MG tablet Commonly known as:  MOBIC   traMADol 50 MG tablet Commonly known as:  ULTRAM     TAKE these medications   cefUROXime 250 MG tablet Commonly known as:  CEFTIN Take 1 tablet (250 mg total) by mouth 2 (two) times daily with a meal.   losartan-hydrochlorothiazide 100-25 MG tablet Commonly known as:  HYZAAR Take 1 tablet by mouth daily.   omeprazole 20 MG capsule Commonly known as:  PRILOSEC Take 20 mg by mouth daily.   phenytoin 100 MG ER capsule Commonly known as:  DILANTIN Take 200 mg by mouth 2 (two) times daily.   pravastatin 20 MG tablet Commonly known as:  PRAVACHOL Take 20 mg by mouth daily.         DISCHARGE INSTRUCTIONS:   DIET:  Cardiac diet  DISCHARGE CONDITION:  Stable  ACTIVITY:  Activity as tolerated  OXYGEN:  Home Oxygen: No.   Oxygen Delivery: room air  DISCHARGE LOCATION:  nursing home   If you experience worsening of your admission symptoms, develop shortness of breath, life threatening emergency, suicidal or homicidal thoughts you must seek medical attention immediately by calling 911 or calling your MD immediately  if symptoms less severe.  You Must read complete instructions/literature along with all the possible adverse reactions/side effects for all the Medicines you take and that have been prescribed to you. Take any new Medicines after you have completely understood and accpet all the possible adverse reactions/side  effects.   Please note  You were cared for by a hospitalist during your hospital stay. If you have any questions about your discharge medications or the care you received while you were in the hospital after you are discharged, you can call the unit and asked to speak with the hospitalist on call if the hospitalist that took care of you is not available. Once you are discharged, your primary care physician will handle any further medical issues. Please note that NO REFILLS for any  discharge medications will be authorized once you are discharged, as it is imperative that you return to your primary care physician (or establish a relationship with a primary care physician if you do not have one) for your aftercare needs so that they can reassess your need for medications and monitor your lab values.     Today   Mental status Stable.  No acute events overnight. No seizures overnight. Will d/c to SNF today.   VITAL SIGNS:  Blood pressure 137/67, pulse 73, temperature 98.2 F (36.8 C), temperature source Oral, resp. rate 20, height 5\' 4"  (1.626 m), weight 79.7 kg (175 lb 11.2 oz), SpO2 93 %.  I/O:  No intake or output data in the 24 hours ending 06/08/16 0911  PHYSICAL EXAMINATION:  GENERAL:  82 y.o.-year-old patient lying in the bed with no acute distress.  EYES: Pupils equal, round, reactive to light and accommodation. No scleral icterus. Extraocular muscles intact.  HEENT: Head atraumatic, normocephalic. Oropharynx and nasopharynx clear.  NECK:  Supple, no jugular venous distention. No thyroid enlargement, no tenderness.  LUNGS: Normal breath sounds bilaterally, no wheezing, rales,rhonchi. No use of accessory muscles of respiration.  CARDIOVASCULAR: S1, S2 normal. No murmurs, rubs, or gallops.  ABDOMEN: Soft, non-tender, non-distended. Bowel sounds present. No organomegaly or mass.  EXTREMITIES: No pedal edema, cyanosis, or clubbing.  NEUROLOGIC: Cranial nerves II through XII are intact. No focal motor or sensory defecits b/l.  PSYCHIATRIC: The patient is alert and oriented x 1.  SKIN: No obvious rash, lesion, or ulcer.   DATA REVIEW:   CBC  Recent Labs Lab 06/04/16 0659  WBC 14.3*  HGB 10.2*  HCT 29.0*  PLT 211    Chemistries   Recent Labs Lab 06/03/16 1815  06/08/16 0413  NA 134*  < > 136  K 3.0*  < > 3.6  CL 101  < > 105  CO2 25  < > 27  GLUCOSE 133*  < > 95  BUN 20  < > <5*  CREATININE 1.31*  < > 1.00  CALCIUM 8.8*  < > 8.3*  MG  --   <  > 1.6*  AST 23  --   --   ALT 23  --   --   ALKPHOS 42  --   --   BILITOT 0.6  --   --   < > = values in this interval not displayed.  Cardiac Enzymes  Recent Labs Lab 06/03/16 1815  TROPONINI <0.03    Microbiology Results  Results for orders placed or performed during the hospital encounter of 06/03/16  Blood Culture (routine x 2)     Status: None   Collection Time: 06/03/16  6:16 PM  Result Value Ref Range Status   Specimen Description BLOOD RIGHT ASSIST CONTROL  Final   Special Requests BOTTLES DRAWN AEROBIC AND ANAEROBIC BCAV  Final   Culture NO GROWTH 5 DAYS  Final   Report Status 06/08/2016 FINAL  Final  Blood Culture (  routine x 2)     Status: None   Collection Time: 06/03/16  6:28 PM  Result Value Ref Range Status   Specimen Description BLOOD LEFT HAND  Final   Special Requests BOTTLES DRAWN AEROBIC AND ANAEROBIC BCAV  Final   Culture NO GROWTH 5 DAYS  Final   Report Status 06/08/2016 FINAL  Final  Urine culture     Status: Abnormal   Collection Time: 06/03/16  6:39 PM  Result Value Ref Range Status   Specimen Description URINE, RANDOM  Final   Special Requests NONE  Final   Culture >=100,000 COLONIES/mL ESCHERICHIA COLI (A)  Final   Report Status 06/06/2016 FINAL  Final   Organism ID, Bacteria ESCHERICHIA COLI (A)  Final      Susceptibility   Escherichia coli - MIC*    AMPICILLIN <=2 SENSITIVE Sensitive     CEFAZOLIN <=4 SENSITIVE Sensitive     CEFTRIAXONE <=1 SENSITIVE Sensitive     CIPROFLOXACIN <=0.25 SENSITIVE Sensitive     GENTAMICIN <=1 SENSITIVE Sensitive     IMIPENEM <=0.25 SENSITIVE Sensitive     NITROFURANTOIN <=16 SENSITIVE Sensitive     TRIMETH/SULFA <=20 SENSITIVE Sensitive     AMPICILLIN/SULBACTAM <=2 SENSITIVE Sensitive     PIP/TAZO <=4 SENSITIVE Sensitive     Extended ESBL NEGATIVE Sensitive     * >=100,000 COLONIES/mL ESCHERICHIA COLI    RADIOLOGY:  No results found.    Management plans discussed with the patient, family and they  are in agreement.  CODE STATUS:     Code Status Orders        Start     Ordered   06/04/16 0011  Full code  Continuous     06/04/16 0010    Code Status History    Date Active Date Inactive Code Status Order ID Comments User Context   This patient has a current code status but no historical code status.      TOTAL TIME TAKING CARE OF THIS PATIENT: 40 minutes.    Houston Siren M.D on 06/08/2016 at 9:11 AM  Between 7am to 6pm - Pager - [phone]  After 6pm go to [email or web address] - Social research officer, government  [facility] [place] [facility]  Office  [phone]  CC: Primary care physician; Pcp Not In System

## 2016-06-08 NOTE — Progress Notes (Signed)
After speaking with son earlier about discharge plan to [facility], he was adamant about pt coming home. This RN spoke with Child psychotherapistsocial worker and MD and voiced his concerns. Arrangements were made for pt to go home with son with home health. Transport cancelled and pt currently awaiting son's arrival.

## 2016-06-08 NOTE — Care Management (Signed)
Spoke with Hannah QuerySarah Gordon Clinical Social worker. States that family wants to take Ms. Hannah Gordon home with home health.  Telephone call to daughter, Hannah Berkshirerisha [phone]). Discussed services and agencies in the home. Chose [facility]. Hannah GottronJason Gordon, [facility] representative updated.  Son will transport. Hannah Gordon updated. Hannah GreetBrenda S Aileana Hodder RN MSN CCM Care management

## 2016-06-08 NOTE — Care Management Important Message (Signed)
Important Message  Patient Details  Name: Hannah Gordon MRN: [id number] Date of Birth: 12-May-1934   Medicare Important Message Given:  Yes    Gwenette GreetBrenda S Kolter Reaver, RN 06/08/2016, 8:39 AM

## 2016-06-08 NOTE — Progress Notes (Signed)
Discharge  Son arrived to take mother home. Son was given prescription and instructed to take to any pharmacy to fill and start tonight. Son was also instructed of home PT to call and make arrangements with him. PIV removed and RN and grand-daughter helped pt get dressed. NA provided wheelchair and took pt to lobby. Pt reports no pain at time of discharge.

## 2016-06-08 NOTE — Progress Notes (Signed)
Discharge   [facility] [place] [facility].  Pt was prepared for transport to [facility] by NA. Transport called and informed of transportation needs. PIV removed and pt has no complaints of pain at this time. Pt currently awaiting transport. Paperwork in chart.

## 2017-01-14 DEATH — deceased

## 2017-02-13 DEATH — deceased

## 2017-09-11 IMAGING — DX DG CHEST 1V PORT
1 series · 1 of 1 positions shown · non-contrast
Comparison: 11/12/2013

CLINICAL DATA: Altered mental status with vomiting

EXAM:
PORTABLE CHEST 1 VIEW

[chest ap]
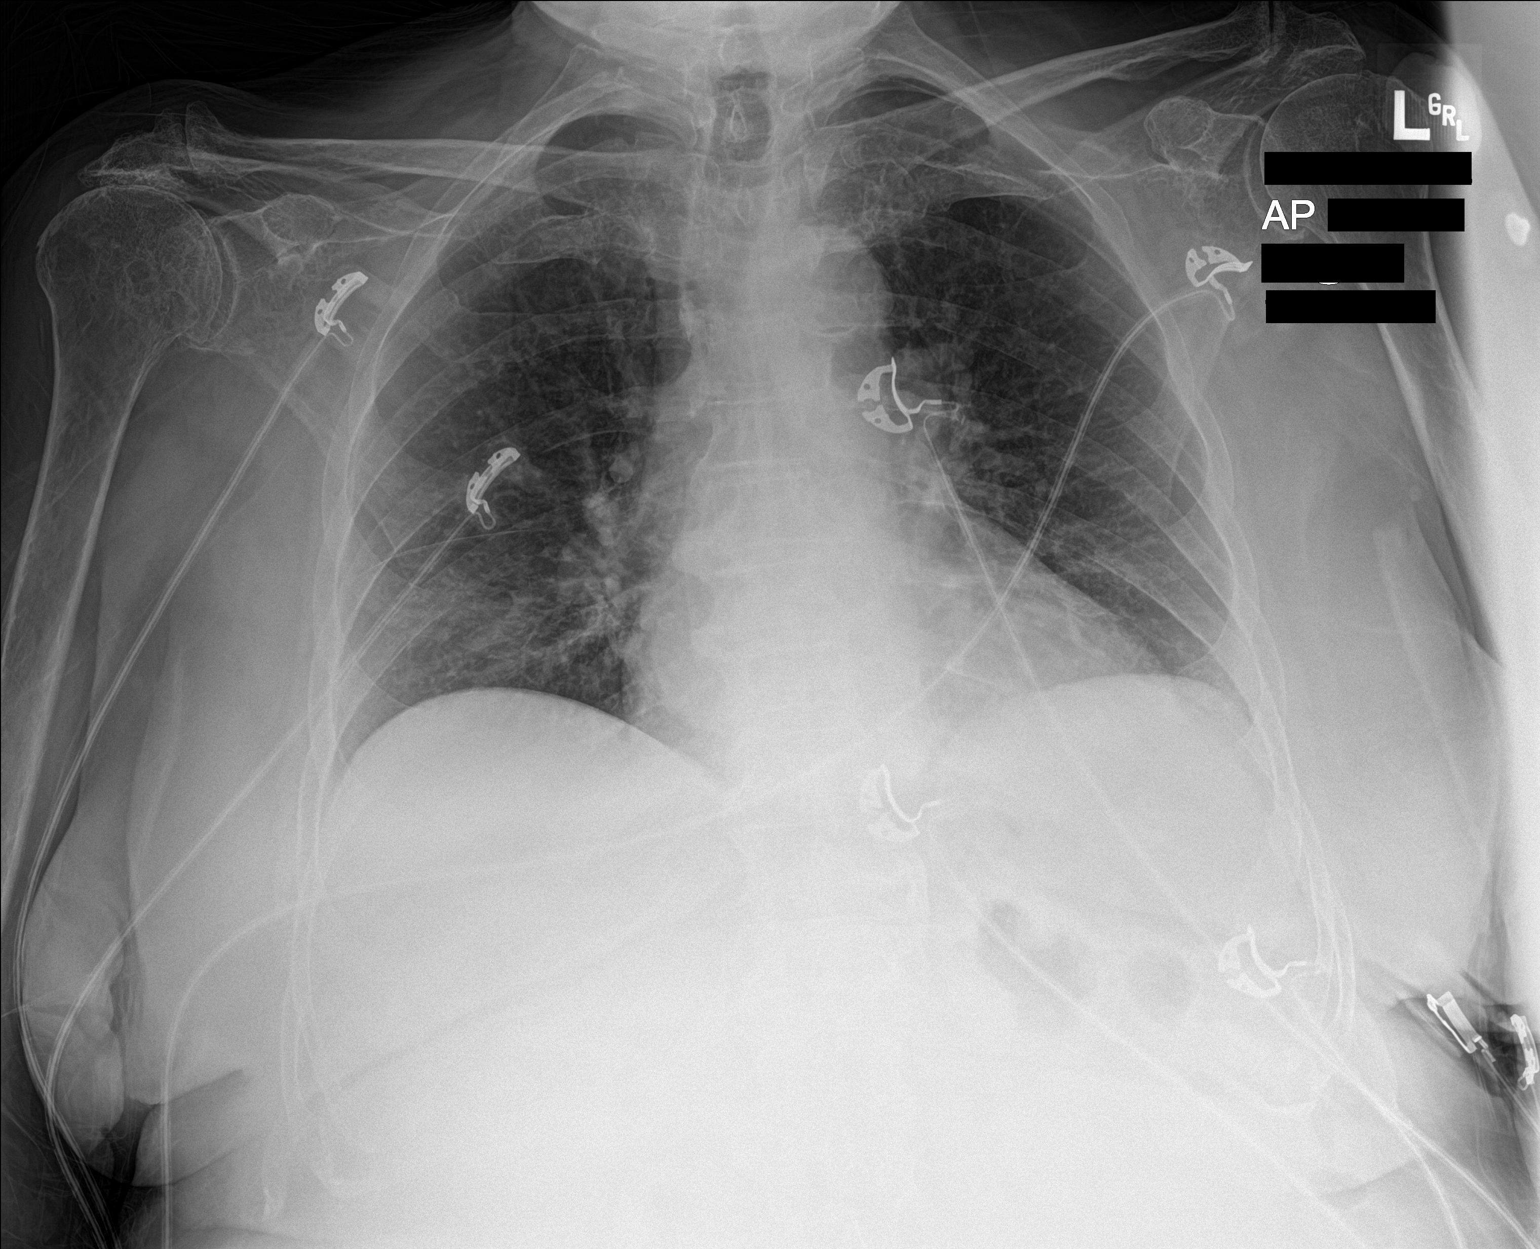

[1 of 1 positions shown; findings below may reference images not displayed]

FINDINGS: Mildly low lung volumes with bibasilar atelectasis. Borderline to
mild cardiomegaly. No edema. No pneumothorax. Degenerative changes
of the bilateral shoulders.
IMPRESSION: Low lung volume with mild bibasilar atelectasis. Borderline to mild
cardiomegaly without edema.
